# Patient Record
Sex: Female | Born: 1990 | Race: Black or African American | Hispanic: No | Marital: Single | Smoking: Never smoker
Health system: Southern US, Community
[De-identification: ages and names within clinical notes are randomized; demographics above are authoritative.]

## PROBLEM LIST (undated history)

## (undated) ENCOUNTER — Inpatient Hospital Stay (HOSPITAL_COMMUNITY): Payer: Self-pay

## (undated) ENCOUNTER — Emergency Department (HOSPITAL_COMMUNITY): Admission: EM | Discharge status: Against Medical Advice | Payer: Medicaid Other

## (undated) DIAGNOSIS — G43909 Migraine, unspecified, not intractable, without status migrainosus: Secondary | ICD-10-CM

## (undated) DIAGNOSIS — A599 Trichomoniasis, unspecified: Secondary | ICD-10-CM

## (undated) DIAGNOSIS — A749 Chlamydial infection, unspecified: Secondary | ICD-10-CM

## (undated) DIAGNOSIS — M549 Dorsalgia, unspecified: Secondary | ICD-10-CM

## (undated) HISTORY — PX: DILATION AND CURETTAGE OF UTERUS: SHX78

---
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
## 2012-11-19 ENCOUNTER — Encounter (HOSPITAL_COMMUNITY): Payer: Self-pay | Admitting: Emergency Medicine

## 2012-11-19 ENCOUNTER — Emergency Department (HOSPITAL_COMMUNITY)
Admission: EM | Admit: 2012-11-19 | Discharge: 2012-11-19 | Disposition: A | Discharge status: Home / Self Care | Payer: Medicaid Other | Attending: Emergency Medicine | Admitting: Emergency Medicine

## 2012-11-19 DIAGNOSIS — B9789 Other viral agents as the cause of diseases classified elsewhere: Secondary | ICD-10-CM | POA: Insufficient documentation

## 2012-11-19 DIAGNOSIS — H9202 Otalgia, left ear: Secondary | ICD-10-CM

## 2012-11-19 DIAGNOSIS — B349 Viral infection, unspecified: Secondary | ICD-10-CM

## 2012-11-19 DIAGNOSIS — J069 Acute upper respiratory infection, unspecified: Secondary | ICD-10-CM | POA: Insufficient documentation

## 2012-11-19 DIAGNOSIS — J3489 Other specified disorders of nose and nasal sinuses: Secondary | ICD-10-CM | POA: Insufficient documentation

## 2012-11-19 DIAGNOSIS — H9209 Otalgia, unspecified ear: Secondary | ICD-10-CM | POA: Insufficient documentation

## 2012-11-19 MED ORDER — ACETAMINOPHEN 325 MG PO TABS
650.0000 mg | ORAL_TABLET | Freq: Once | ORAL | Status: AC
  Administered 2012-11-19: 650 mg via ORAL
  Filled 2012-11-19 (×2): qty 2

## 2012-11-19 MED ORDER — PSEUDOEPHEDRINE HCL 60 MG PO TABS: 30.0000 mg | ORAL_TABLET | ORAL | Status: DC | PRN

## 2012-11-19 MED ORDER — IBUPROFEN 800 MG PO TABS
800.0000 mg | ORAL_TABLET | Freq: Once | ORAL | Status: AC
  Administered 2012-11-19: 800 mg via ORAL
  Filled 2012-11-19: qty 1

## 2012-11-19 NOTE — ED Provider Notes (Signed)
CSN: [id number]     Arrival date & time 11/19/12  0123 History     First MD Initiated Contact with Patient 11/19/12 0231     Chief Complaint  Patient presents with  . Fever   HPI.Catherine Weber is a 22 y.o. female complains of ear pain has been sharp, intermittent, severe, starting last night associated with fever, she's taking ibuprofen, 2 every 6 hours without relief. She's had some nasal congestion. Denies any headaches, stiff neck, chest pain, shortness of breath, productive cough, abdominal pain, nausea, vomiting, diarrhea.History reviewed. No pertinent past medical history. History reviewed. No pertinent past surgical history. No family history on file. History  Substance Use Topics  . Smoking status: Never Smoker   . Smokeless tobacco: Not on file  . Alcohol Use: No   OB History   Grav Para Term Preterm Abortions TAB SAB Ect Mult Living                 Review of Systems At least 10pt or greater review of systems completed and are negative except where specified in the HPI.  Allergies  Review of patient's allergies indicates no known allergies.  Home Medications   Current Outpatient Rx  Name  Route  Sig  Dispense  Refill  . ibuprofen (ADVIL,MOTRIN) 200 MG tablet   Oral   Take 400 mg by mouth every 6 (six) hours as needed for pain or headache.          BP 121/71  Pulse 103  Temp(Src) 100.1 F (37.8 C) (Oral)  Resp 18  SpO2 98%  LMP 10/31/2012 Physical Exam  Nursing notes reviewed.  Electronic medical record reviewed. VITAL SIGNS:   Filed Vitals:   11/19/12 0126 11/19/12 0231 11/19/12 0304  BP: 121/71  95/62  Pulse: 103  82  Temp: 102.2 F (39 C) 100.1 F (37.8 C) 100.1 F (37.8 C)  TempSrc: Oral Oral Oral  Resp: 18  16  SpO2: 98%  100%   CONSTITUTIONAL: Awake, oriented, appears non-toxic HENT: Atraumatic, normocephalic, oral mucosa pink and moist, airway patent. Nasal congestion with boggy nasal turbinates. External ears normal. Clear fluid behind  left TM, no erythema, no purulence EYES: Conjunctiva clear, EOMI, PERRLA NECK: Trachea midline, non-tender, supple CARDIOVASCULAR: Normal heart rate, Normal rhythm, No murmurs, rubs, gallops PULMONARY/CHEST: Clear to auscultation, no rhonchi, wheezes, or rales. Symmetrical breath sounds. Non-tender. ABDOMINAL: Non-distended, soft, non-tender - no rebound or guarding.  BS normal. NEUROLOGIC: Non-focal, moving all four extremities, no gross sensory or motor deficits. EXTREMITIES: No clubbing, cyanosis, or edema SKIN: Warm, Dry, No erythema, No rash  ED Course   Procedures (including critical care time)  Labs Reviewed - No data to display No results found. 1. Viral syndrome   2. URI (upper respiratory infection)   3. Otalgia, left     MDM  Catherine Weber is a 22 y.o. female patient with viral syndrome, upper respiratory infection causing congestion in the upper airways and likely occluding eustachian tube causing some pressure behind left TM. Does not appear infected, do not think she requires antibiotics. Patient has not been taking effective amounts of antipyretics, treat the patient's pain using ibuprofen, and she can start using Sudafed tomorrow morning to help her congestion.  .The patient appears reasonably screened and/or stabilized for discharge and I doubt any other medical condition or other [facility] requiring further screening, evaluation, or treatment in the ED exists or is present at this time prior to discharge.  Jones Skene, MD 11/20/12 [phone]

## 2012-11-19 NOTE — ED Notes (Addendum)
PT. REPORTS FEVER WITH BODY ACHES / BILATERAL EYE PHOTOPHOBIA ONSET LAST NIGHT UNRELIEVED BY OTC IBUPROFEN . DENIES COUGH OR CONGESTION . NO URINARY DISCOMFORT.

## 2012-11-19 NOTE — ED Notes (Signed)
Pt states that starting Monday morning she started feeling feverish and was sweating, she also says that her eyes were hurting and it was difficult to look up.

## 2012-11-22 ENCOUNTER — Emergency Department (HOSPITAL_COMMUNITY)
Admission: EM | Admit: 2012-11-22 | Discharge: 2012-11-22 | Disposition: A | Discharge status: Home / Self Care | Payer: Medicaid Other | Attending: Emergency Medicine | Admitting: Emergency Medicine

## 2012-11-22 ENCOUNTER — Encounter (HOSPITAL_COMMUNITY): Payer: Self-pay | Admitting: Emergency Medicine

## 2012-11-22 DIAGNOSIS — R197 Diarrhea, unspecified: Secondary | ICD-10-CM | POA: Insufficient documentation

## 2012-11-22 DIAGNOSIS — B349 Viral infection, unspecified: Secondary | ICD-10-CM

## 2012-11-22 DIAGNOSIS — B9789 Other viral agents as the cause of diseases classified elsewhere: Secondary | ICD-10-CM | POA: Insufficient documentation

## 2012-11-22 DIAGNOSIS — R51 Headache: Secondary | ICD-10-CM | POA: Insufficient documentation

## 2012-11-22 DIAGNOSIS — J069 Acute upper respiratory infection, unspecified: Secondary | ICD-10-CM | POA: Insufficient documentation

## 2012-11-22 DIAGNOSIS — R110 Nausea: Secondary | ICD-10-CM | POA: Insufficient documentation

## 2012-11-22 LAB — POCT PREGNANCY, URINE: Preg Test, Ur: NEGATIVE

## 2012-11-22 LAB — URINALYSIS, ROUTINE W REFLEX MICROSCOPIC
Hgb urine dipstick: NEGATIVE
Nitrite: NEGATIVE
Specific Gravity, Urine: 1.022 (ref 1.005–1.030)
pH: 6.5 (ref 5.0–8.0)

## 2012-11-22 LAB — URINE MICROSCOPIC-ADD ON

## 2012-11-22 MED ORDER — DIPHENHYDRAMINE HCL 50 MG/ML IJ SOLN
25.0000 mg | Freq: Once | INTRAMUSCULAR | Status: AC
  Administered 2012-11-22: 25 mg via INTRAVENOUS
  Filled 2012-11-22: qty 1

## 2012-11-22 MED ORDER — METOCLOPRAMIDE HCL 5 MG/ML IJ SOLN
5.0000 mg | Freq: Once | INTRAMUSCULAR | Status: AC
  Administered 2012-11-22: 5 mg via INTRAVENOUS
  Filled 2012-11-22: qty 2

## 2012-11-22 MED ORDER — SODIUM CHLORIDE 0.9 % IV BOLUS (SEPSIS)
1000.0000 mL | Freq: Once | INTRAVENOUS | Status: AC
  Administered 2012-11-22: 1000 mL via INTRAVENOUS

## 2012-11-22 NOTE — ED Notes (Signed)
PT. REPORTS DIARRHEA , HEADACHE AND NAUSEA FOR SEVERAL DAYS , DENIES EMESIS , SEEN HERE 10/19/2012 DIAGNOSED WITH VIRAL ILLNESS / URI PRESCRIBED WITH SUDAFED WITH NO IMPROVEMENT.

## 2012-11-23 NOTE — ED Provider Notes (Signed)
CSN: [id number]     Arrival date & time 11/22/12  1747 History     First MD Initiated Contact with Patient 11/22/12 2035     Chief Complaint  Patient presents with  . Diarrhea   (Consider location/radiation/quality/duration/timing/severity/associated sxs/prior Treatment) Patient is a 22 y.o. female presenting with diarrhea. The history is provided by the patient.  Diarrhea Associated symptoms: headaches   Associated symptoms: no abdominal pain, no fever and no vomiting   This is a 22 year old female who presents with diarrhea, headache and nausea. Patient was diagnosed 3 days ago with a viral illness and URI. The patient states that since that time she has developed diarrhea without vomiting. She does endorse nausea. She states she has had a persistent headache without vision changes or neurologic deficit. Ibuprofen has not helped. She denies any fevers but does endorse chills.  History reviewed. No pertinent past medical history. History reviewed. No pertinent past surgical history. No family history on file. History  Substance Use Topics  . Smoking status: Never Smoker   . Smokeless tobacco: Not on file  . Alcohol Use: No   OB History   Grav Para Term Preterm Abortions TAB SAB Ect Mult Living                 Review of Systems  Constitutional: Negative.  Negative for fever.  HENT: Negative for ear pain.   Cardiovascular: Negative for chest pain.  Gastrointestinal: Positive for nausea and diarrhea. Negative for vomiting and abdominal pain.  Genitourinary: Negative for dysuria.  Skin: Negative for rash.  Neurological: Positive for headaches. Negative for dizziness and syncope.  Psychiatric/Behavioral: Negative for confusion.  All other systems reviewed and are negative.    Allergies  Review of patient's allergies indicates no known allergies.  Home Medications   Current Outpatient Rx  Name  Route  Sig  Dispense  Refill  . ibuprofen (ADVIL,MOTRIN) 200 MG tablet  Oral   Take 400 mg by mouth every 6 (six) hours as needed for pain or headache.         . Multiple Vitamins-Minerals (MULTIVITAMIN PO)   Oral   Take 1 tablet by mouth daily.         . pseudoephedrine (SUDAFED) 60 MG tablet   Oral   Take 0.5 tablets (30 mg total) by mouth every 4 (four) hours as needed for congestion.   30 tablet   0    BP 108/72  Pulse 80  Temp(Src) 99.9 F (37.7 C) (Oral)  Resp 16  SpO2 100%  LMP 10/31/2012 Physical Exam  Nursing note and vitals reviewed. Constitutional: She is oriented to person, place, and time. She appears well-developed and well-nourished.  HENT:  Head: Normocephalic and atraumatic.  Eyes: Pupils are equal, round, and reactive to light.  Neck: Normal range of motion. Neck supple.  Cardiovascular: Normal rate, regular rhythm and normal heart sounds.   Pulmonary/Chest: Effort normal and breath sounds normal.  Abdominal: Soft. Bowel sounds are normal.  Neurological: She is alert and oriented to person, place, and time.  Skin: Skin is warm and dry.  Psychiatric: She has a normal mood and affect.    ED Course   Procedures (including critical care time)  Labs Reviewed  URINALYSIS, ROUTINE W REFLEX MICROSCOPIC - Abnormal; Notable for the following:    APPearance CLOUDY (*)    Ketones, ur 40 (*)    Leukocytes, UA TRACE (*)    All other components within normal limits  URINE MICROSCOPIC-ADD ON -  Abnormal; Notable for the following:    Squamous Epithelial / LPF FEW (*)    Bacteria, UA FEW (*)    All other components within normal limits  POCT PREGNANCY, URINE   No results found. 1. Viral syndrome   2. Diarrhea     MDM  Is a 22 year old female who presents with diarrhea, headache and nausea. She is nontoxic-appearing on exam and her vital signs are within normal limits. Patient was given Reglan, Benadryl, and a normal saline bolus for her headache. Urinalysis is negative for acute changes suggesting that she is hydrated.   Patient had improvement of her symptoms while in the ER.  Given her recent prodrome of URI, nausea, and diarrhea I feel her symptoms are still most consistent with a viral syndrome. Her belly is benign. She has no meningismus suggestive of meningitis.  Patient was discharged home and curettage to use ibuprofen and Tylenol as needed for her headache.  Shon Baton, MD 11/23/12 [phone]

## 2015-01-27 ENCOUNTER — Encounter (HOSPITAL_COMMUNITY): Payer: Self-pay | Admitting: *Deleted

## 2015-01-27 ENCOUNTER — Emergency Department (HOSPITAL_COMMUNITY)
Admission: EM | Admit: 2015-01-27 | Discharge: 2015-01-27 | Disposition: A | Discharge status: Home / Self Care | Payer: BLUE CROSS/BLUE SHIELD | Attending: Emergency Medicine | Admitting: Emergency Medicine

## 2015-01-27 DIAGNOSIS — R51 Headache: Secondary | ICD-10-CM

## 2015-01-27 DIAGNOSIS — Z79899 Other long term (current) drug therapy: Secondary | ICD-10-CM | POA: Diagnosis not present

## 2015-01-27 DIAGNOSIS — M25512 Pain in left shoulder: Secondary | ICD-10-CM

## 2015-01-27 DIAGNOSIS — Y9389 Activity, other specified: Secondary | ICD-10-CM | POA: Insufficient documentation

## 2015-01-27 DIAGNOSIS — Y92410 Unspecified street and highway as the place of occurrence of the external cause: Secondary | ICD-10-CM | POA: Insufficient documentation

## 2015-01-27 DIAGNOSIS — Y998 Other external cause status: Secondary | ICD-10-CM | POA: Diagnosis not present

## 2015-01-27 DIAGNOSIS — S0990XA Unspecified injury of head, initial encounter: Secondary | ICD-10-CM | POA: Diagnosis present

## 2015-01-27 DIAGNOSIS — S4992XA Unspecified injury of left shoulder and upper arm, initial encounter: Secondary | ICD-10-CM | POA: Diagnosis not present

## 2015-01-27 DIAGNOSIS — R519 Headache, unspecified: Secondary | ICD-10-CM

## 2015-01-27 MED ORDER — IBUPROFEN 400 MG PO TABS
800.0000 mg | ORAL_TABLET | Freq: Once | ORAL | Status: AC
  Administered 2015-01-27: 800 mg via ORAL
  Filled 2015-01-27: qty 2

## 2015-01-27 MED ORDER — METHOCARBAMOL 500 MG PO TABS: 500.0000 mg | ORAL_TABLET | Freq: Two times a day (BID) | ORAL | Status: DC

## 2015-01-27 MED ORDER — IBUPROFEN 800 MG PO TABS: 800.0000 mg | ORAL_TABLET | Freq: Three times a day (TID) | ORAL | Status: DC
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
## 2015-02-14 ENCOUNTER — Other Ambulatory Visit (HOSPITAL_COMMUNITY)
Admission: RE | Admit: 2015-02-14 | Discharge: 2015-02-14 | Disposition: A | Discharge status: Home / Self Care | Payer: BLUE CROSS/BLUE SHIELD | Source: Ambulatory Visit | Attending: Family Medicine | Admitting: Family Medicine

## 2015-02-14 ENCOUNTER — Emergency Department (HOSPITAL_COMMUNITY)
Admission: EM | Admit: 2015-02-14 | Discharge: 2015-02-14 | Disposition: A | Discharge status: Home / Self Care | Payer: BLUE CROSS/BLUE SHIELD | Source: Home / Self Care

## 2015-02-14 ENCOUNTER — Encounter (HOSPITAL_COMMUNITY): Payer: Self-pay | Admitting: Emergency Medicine

## 2015-02-14 DIAGNOSIS — N760 Acute vaginitis: Secondary | ICD-10-CM

## 2015-02-14 DIAGNOSIS — N390 Urinary tract infection, site not specified: Secondary | ICD-10-CM

## 2015-02-14 DIAGNOSIS — Z113 Encounter for screening for infections with a predominantly sexual mode of transmission: Secondary | ICD-10-CM | POA: Diagnosis present

## 2015-02-14 LAB — POCT PREGNANCY, URINE: PREG TEST UR: NEGATIVE

## 2015-02-14 MED ORDER — CEFTRIAXONE SODIUM 250 MG IJ SOLR
250.0000 mg | Freq: Once | INTRAMUSCULAR | Status: AC
  Administered 2015-02-14: 250 mg via INTRAMUSCULAR

## 2015-02-14 MED ORDER — LIDOCAINE HCL (PF) 1 % IJ SOLN
INTRAMUSCULAR | Status: AC
  Filled 2015-02-14: qty 5

## 2015-02-14 MED ORDER — AZITHROMYCIN 250 MG PO TABS
ORAL_TABLET | ORAL | Status: AC
  Filled 2015-02-14: qty 4

## 2015-02-14 MED ORDER — METRONIDAZOLE 500 MG PO TABS: ORAL_TABLET | ORAL | Status: DC

## 2015-02-14 MED ORDER — AZITHROMYCIN 250 MG PO TABS
1000.0000 mg | ORAL_TABLET | Freq: Once | ORAL | Status: AC
  Administered 2015-02-14: 1000 mg via ORAL

## 2015-02-14 MED ORDER — CEFTRIAXONE SODIUM 250 MG IJ SOLR
INTRAMUSCULAR | Status: AC
  Filled 2015-02-14: qty 250

## 2015-02-14 MED ORDER — SULFAMETHOXAZOLE-TRIMETHOPRIM 800-160 MG PO TABS: 1.0000 | ORAL_TABLET | Freq: Two times a day (BID) | ORAL | Status: AC

## 2015-02-14 NOTE — ED Notes (Signed)
The patient [facility] with a complaint of vaginal swelling and discharge that started 4 days ago.

## 2015-02-14 NOTE — ED Provider Notes (Signed)
CSN: [id number]     Arrival date & time 02/14/15  1307 History   None    Chief Complaint  Patient presents with  . Groin Swelling  . Vaginal Discharge   (Consider location/radiation/quality/duration/timing/severity/associated sxs/prior Treatment) HPI History obtained from patient:   LOCATION:vagina SEVERITY: no pain DURATION: over 1 week CONTEXT: onset of pain, swelling, discharge (yellow) after new sexual partner QUALITY:sore  MODIFYING FACTORS:none ASSOCIATED SYMPTOMS: smelly discharge TIMING:constant  History reviewed. No pertinent past medical history. History reviewed. No pertinent past surgical history. History reviewed. No pertinent family history. Social History  Substance Use Topics  . Smoking status: Never Smoker   . Smokeless tobacco: None  . Alcohol Use: No   OB History    No data available     Review of Systems ROS +'ve vaginal discharge, pain  Denies: HEADACHE, NAUSEA, ABDOMINAL PAIN, CHEST PAIN, CONGESTION, DYSURIA, SHORTNESS OF BREATH  Allergies  Review of patient's allergies indicates no known allergies.  Home Medications   Prior to Admission medications   Medication Sig Start Date End Date Taking? Authorizing Provider  ibuprofen (ADVIL,MOTRIN) 200 MG tablet Take 400 mg by mouth every 6 (six) hours as needed for pain or headache.    Historical Provider, MD  ibuprofen (ADVIL,MOTRIN) 800 MG tablet Take 1 tablet (800 mg total) by mouth 3 (three) times daily. 01/27/15   Shawn C Joy, PA-C  methocarbamol (ROBAXIN) 500 MG tablet Take 1 tablet (500 mg total) by mouth 2 (two) times daily. 01/27/15   Shawn C Joy, PA-C  metroNIDAZOLE (FLAGYL) 500 MG tablet Take all 4 tablets at one time. 02/14/15   Konrad Felix, PA  Multiple Vitamins-Minerals (MULTIVITAMIN PO) Take 1 tablet by mouth daily.    Historical Provider, MD  pseudoephedrine (SUDAFED) 60 MG tablet Take 0.5 tablets (30 mg total) by mouth every 4 (four) hours as needed for congestion. 11/19/12    [name], MD  sulfamethoxazole-trimethoprim (BACTRIM DS,SEPTRA DS) 800-160 MG tablet Take 1 tablet by mouth 2 (two) times daily. 02/14/15 02/21/15  Konrad Felix, PA   Meds Ordered and Administered this Visit   Medications  cefTRIAXone (ROCEPHIN) injection 250 mg (not administered)  azithromycin (ZITHROMAX) tablet 1,000 mg (not administered)    BP 116/73 mmHg  Pulse 83  Temp(Src) 98.7 F (37.1 C) (Oral)  Resp 17  SpO2 100%  LMP 02/01/2015 (Exact Date) No data found.   Physical Exam  Constitutional: She appears well-developed and well-nourished. No distress.  HENT:  Head: Normocephalic and atraumatic.  Cardiovascular: Normal rate.   Pulmonary/Chest: Effort normal and breath sounds normal.  Abdominal: Soft. Bowel sounds are normal. There is no tenderness. There is no rebound.  Genitourinary:    Cervix exhibits no motion tenderness and no discharge. There are signs of injury in the vagina. Vaginal discharge found.    Nursing note and vitals reviewed.   ED Course  Procedures (including critical care time)  Labs Review Labs Reviewed  POCT PREGNANCY, URINE  CERVICOVAGINAL ANCILLARY ONLY    Imaging Review No results found.   Visual Acuity Review  Right Eye Distance:   Left Eye Distance:   Bilateral Distance:    Right Eye Near:   Left Eye Near:    Bilateral Near:         MDM   1. Vaginitis   2. UTI (lower urinary tract infection)    Exam does not reveal CMT, no pus from cervix. Discharge is c/w trich  UA is positive  Rx metronidazole and bactrim  Advise to call for culture results in 3 days Return if there are new or worsening of symptoms Use condoms with new sexual partners    Konrad Felix, [place] 02/14/15 [phone]

## 2015-02-14 NOTE — Discharge Instructions (Signed)

## 2015-02-15 LAB — POCT URINALYSIS DIP (DEVICE)
Bilirubin Urine: NEGATIVE
GLUCOSE, UA: NEGATIVE mg/dL
HGB URINE DIPSTICK: NEGATIVE
KETONES UR: NEGATIVE mg/dL
Nitrite: POSITIVE — AB
Protein, ur: NEGATIVE mg/dL
SPECIFIC GRAVITY, URINE: 1.015 (ref 1.005–1.030)
UROBILINOGEN UA: 0.2 mg/dL (ref 0.0–1.0)
pH: 8.5 — ABNORMAL HIGH (ref 5.0–8.0)

## 2015-02-15 LAB — CERVICOVAGINAL ANCILLARY ONLY
CHLAMYDIA, DNA PROBE: NEGATIVE
Neisseria Gonorrhea: NEGATIVE
Trichomonas: NEGATIVE

## 2015-02-15 NOTE — ED Notes (Signed)
GC chlamydia, trich reports negative. Still waiting on wet prep report

## 2015-02-16 LAB — CERVICOVAGINAL ANCILLARY ONLY: Wet Prep (BD Affirm): POSITIVE — AB

## 2015-02-16 NOTE — ED Notes (Signed)
Patient called to discuss lab reports. After verifying ID, discussed positive BV finding, treated adequately w Flagyl Rx day of [facility]CC visit

## 2015-02-26 ENCOUNTER — Emergency Department (HOSPITAL_COMMUNITY)
Admission: EM | Admit: 2015-02-26 | Discharge: 2015-02-26 | Disposition: A | Discharge status: Home / Self Care | Payer: BLUE CROSS/BLUE SHIELD | Attending: Emergency Medicine | Admitting: Emergency Medicine

## 2015-02-26 ENCOUNTER — Encounter (HOSPITAL_COMMUNITY): Payer: Self-pay | Admitting: Emergency Medicine

## 2015-02-26 DIAGNOSIS — R51 Headache: Secondary | ICD-10-CM | POA: Insufficient documentation

## 2015-02-26 DIAGNOSIS — R112 Nausea with vomiting, unspecified: Secondary | ICD-10-CM | POA: Diagnosis not present

## 2015-02-26 DIAGNOSIS — R519 Headache, unspecified: Secondary | ICD-10-CM

## 2015-02-26 DIAGNOSIS — Z79899 Other long term (current) drug therapy: Secondary | ICD-10-CM | POA: Diagnosis not present

## 2015-02-26 DIAGNOSIS — Z3202 Encounter for pregnancy test, result negative: Secondary | ICD-10-CM | POA: Insufficient documentation

## 2015-02-26 LAB — COMPREHENSIVE METABOLIC PANEL
ALT: 16 U/L (ref 14–54)
AST: 27 U/L (ref 15–41)
Albumin: 4 g/dL (ref 3.5–5.0)
Alkaline Phosphatase: 53 U/L (ref 38–126)
Anion gap: 7 (ref 5–15)
BUN: 6 mg/dL (ref 6–20)
CHLORIDE: 105 mmol/L (ref 101–111)
CO2: 27 mmol/L (ref 22–32)
CREATININE: 0.83 mg/dL (ref 0.44–1.00)
Calcium: 9.5 mg/dL (ref 8.9–10.3)
GFR calc Af Amer: >60 mL/min (ref >60)
Glucose, Bld: 90 mg/dL (ref 65–99)
Potassium: 3.4 mmol/L — ABNORMAL LOW (ref 3.5–5.1)
Sodium: 139 mmol/L (ref 135–145)
Total Bilirubin: 0.4 mg/dL (ref 0.3–1.2)
Total Protein: 7.6 g/dL (ref 6.5–8.1)

## 2015-02-26 LAB — URINALYSIS, ROUTINE W REFLEX MICROSCOPIC
BILIRUBIN URINE: NEGATIVE
GLUCOSE, UA: NEGATIVE mg/dL
KETONES UR: 15 mg/dL — AB
Leukocytes, UA: NEGATIVE
Nitrite: NEGATIVE
PROTEIN: NEGATIVE mg/dL
Specific Gravity, Urine: 1.02 (ref 1.005–1.030)
pH: 5.5 (ref 5.0–8.0)

## 2015-02-26 LAB — URINE MICROSCOPIC-ADD ON

## 2015-02-26 LAB — CBC
HCT: 36.9 % (ref 36.0–46.0)
Hemoglobin: 12.1 g/dL (ref 12.0–15.0)
MCH: 27.6 pg (ref 26.0–34.0)
MCHC: 32.8 g/dL (ref 30.0–36.0)
MCV: 84.2 fL (ref 78.0–100.0)
PLATELETS: 174 10*3/uL (ref 150–400)
RBC: 4.38 MIL/uL (ref 3.87–5.11)
RDW: 13.2 % (ref 11.5–15.5)
WBC: 5.3 10*3/uL (ref 4.0–10.5)

## 2015-02-26 LAB — POC URINE PREG, ED: Preg Test, Ur: NEGATIVE

## 2015-02-26 MED ORDER — DIPHENHYDRAMINE HCL 50 MG/ML IJ SOLN
25.0000 mg | Freq: Once | INTRAMUSCULAR | Status: AC
  Administered 2015-02-26: 25 mg via INTRAMUSCULAR
  Filled 2015-02-26: qty 1

## 2015-02-26 MED ORDER — KETOROLAC TROMETHAMINE 30 MG/ML IJ SOLN
30.0000 mg | Freq: Once | INTRAMUSCULAR | Status: AC
  Administered 2015-02-26: 30 mg via INTRAMUSCULAR
  Filled 2015-02-26: qty 1

## 2015-02-26 MED ORDER — METOCLOPRAMIDE HCL 5 MG/ML IJ SOLN
10.0000 mg | Freq: Once | INTRAMUSCULAR | Status: AC
  Administered 2015-02-26: 10 mg via INTRAMUSCULAR
  Filled 2015-02-26: qty 2

## 2015-02-26 NOTE — ED Provider Notes (Signed)
CSN: [id number]     Arrival date & time 02/26/15  1901 History  By signing my name below, I, Randa Evens, attest that this documentation has been prepared under the direction and in the presence of Glendell Docker, NP. Electronically Signed: Randa Evens, ED Scribe. 02/26/2015. 9:11 PM.    Chief Complaint  Patient presents with  . Headache  . Emesis     The history is provided by the patient. No language interpreter was used.    HPI Comments: Catherine Weber is a 24 y.o. female who presents to the Emergency Department complaining of intermittent worsening migraine HA that began 5 days prior. Pt states that she has intermittent nausea and vomiting x2. Pt states that she has tried ibuprofen with no relief. Denies neck stiffness, fever, rhinorrhea syncope or other related symptoms.LMP 02/25/2015   History reviewed. No pertinent past medical history. History reviewed. No pertinent past surgical history. No family history on file. Social History  Substance Use Topics  . Smoking status: Never Smoker   . Smokeless tobacco: None  . Alcohol Use: No   OB History    No data available     Review of Systems  Constitutional: Negative for fever.  HENT: Negative for rhinorrhea.   Gastrointestinal: Positive for nausea and vomiting.  Neurological: Positive for headaches.  All other systems reviewed and are negative.     Allergies  Review of patient's allergies indicates no known allergies.  Home Medications   Prior to Admission medications   Medication Sig Start Date End Date Taking? Authorizing Provider  ibuprofen (ADVIL,MOTRIN) 200 MG tablet Take 400 mg by mouth every 6 (six) hours as needed for pain or headache.    Historical Provider, MD  ibuprofen (ADVIL,MOTRIN) 800 MG tablet Take 1 tablet (800 mg total) by mouth 3 (three) times daily. 01/27/15   Shawn C Joy, PA-C  methocarbamol (ROBAXIN) 500 MG tablet Take 1 tablet (500 mg total) by mouth 2 (two) times daily. 01/27/15    Shawn C Joy, PA-C  metroNIDAZOLE (FLAGYL) 500 MG tablet Take all 4 tablets at one time. 02/14/15   Konrad Felix, PA  Multiple Vitamins-Minerals (MULTIVITAMIN PO) Take 1 tablet by mouth daily.    Historical Provider, MD  pseudoephedrine (SUDAFED) 60 MG tablet Take 0.5 tablets (30 mg total) by mouth every 4 (four) hours as needed for congestion. 11/19/12   [name], MD   BP 110/69 mmHg  Pulse 84  Temp(Src) 98.1 F (36.7 C) (Oral)  Resp 16  Ht 4\' 11"  (1.499 m)  Wt 145 lb (65.772 kg)  BMI 29.27 kg/m2  LMP 02/25/2015   Physical Exam  Constitutional: She is oriented to person, place, and time. She appears well-developed and well-nourished. No distress.  HENT:  Head: Normocephalic and atraumatic.  Eyes: Conjunctivae and EOM are normal. Pupils are equal, round, and reactive to light.  Neck: Neck supple. No tracheal deviation present.  Cardiovascular: Normal rate.   Pulmonary/Chest: Effort normal and breath sounds normal. No respiratory distress.  Abdominal: Soft. Bowel sounds are normal. There is no tenderness.  Musculoskeletal: Normal range of motion.  Neurological: She is alert and oriented to person, place, and time. She exhibits normal muscle tone. Coordination normal.  Skin: Skin is warm and dry.  Psychiatric: She has a normal mood and affect. Her behavior is normal.  Nursing note and vitals reviewed.   ED Course  Procedures (including critical care time) DIAGNOSTIC STUDIES: Oxygen Saturation is 100% on RA, normal by my interpretation.  COORDINATION OF CARE: 9:10 PM-Discussed treatment plan with pt at bedside and pt agreed to plan.     Labs Review Labs Reviewed  COMPREHENSIVE METABOLIC PANEL - Abnormal; Notable for the following:    Potassium 3.4 (*)    All other components within normal limits  URINALYSIS, ROUTINE W REFLEX MICROSCOPIC (NOT AT [facility]) - Abnormal; Notable for the following:    APPearance CLOUDY (*)    Hgb urine dipstick LARGE (*)    Ketones, ur 15  (*)    All other components within normal limits  URINE MICROSCOPIC-ADD ON - Abnormal; Notable for the following:    Squamous Epithelial / LPF 0-5 (*)    Bacteria, UA FEW (*)    All other components within normal limits  URINE CULTURE  CBC  POC URINE PREG, ED    Imaging Review No results found.    EKG Interpretation None      MDM   Final diagnoses:  Headache, unspecified headache type   No red flag symptoms. Pt if feeling better after migraine cocktail. Will send home   I personally performed the services described in this documentation, which was scribed in my presence. The recorded information has been reviewed and is accurate.     Glendell Docker, NP 02/26/15 [place], MD 02/27/15 [phone]

## 2015-02-26 NOTE — Discharge Instructions (Signed)

## 2015-02-26 NOTE — ED Notes (Signed)
Pt. reports intermittent headache onset last Sunday and emesis yesterday and today , denies fever / no diarrhea.

## 2015-02-28 LAB — URINE CULTURE

## 2015-10-29 ENCOUNTER — Encounter (HOSPITAL_COMMUNITY): Payer: Self-pay | Admitting: Emergency Medicine

## 2015-10-29 ENCOUNTER — Emergency Department (HOSPITAL_COMMUNITY)
Admission: EM | Admit: 2015-10-29 | Discharge: 2015-10-29 | Disposition: A | Discharge status: Home / Self Care | Payer: BLUE CROSS/BLUE SHIELD | Attending: Emergency Medicine | Admitting: Emergency Medicine

## 2015-10-29 DIAGNOSIS — F17200 Nicotine dependence, unspecified, uncomplicated: Secondary | ICD-10-CM | POA: Diagnosis not present

## 2015-10-29 DIAGNOSIS — B9689 Other specified bacterial agents as the cause of diseases classified elsewhere: Secondary | ICD-10-CM | POA: Diagnosis not present

## 2015-10-29 DIAGNOSIS — N760 Acute vaginitis: Secondary | ICD-10-CM | POA: Diagnosis not present

## 2015-10-29 DIAGNOSIS — R102 Pelvic and perineal pain: Secondary | ICD-10-CM | POA: Diagnosis present

## 2015-10-29 LAB — GC/CHLAMYDIA PROBE AMP (~~LOC~~) NOT AT ARMC
Chlamydia: POSITIVE — AB
NEISSERIA GONORRHEA: NEGATIVE

## 2015-10-29 LAB — COMPREHENSIVE METABOLIC PANEL
ALBUMIN: 4.2 g/dL (ref 3.5–5.0)
ALT: 8 U/L — ABNORMAL LOW (ref 14–54)
ANION GAP: 6 (ref 5–15)
AST: 15 U/L (ref 15–41)
Alkaline Phosphatase: 42 U/L (ref 38–126)
BILIRUBIN TOTAL: 0.5 mg/dL (ref 0.3–1.2)
BUN: 8 mg/dL (ref 6–20)
CHLORIDE: 106 mmol/L (ref 101–111)
CO2: 26 mmol/L (ref 22–32)
Calcium: 9.5 mg/dL (ref 8.9–10.3)
Creatinine, Ser: 0.9 mg/dL (ref 0.44–1.00)
GFR calc Af Amer: >60 mL/min (ref >60)
GFR calc non Af Amer: >60 mL/min (ref >60)
GLUCOSE: 64 mg/dL — AB (ref 65–99)
POTASSIUM: 3.3 mmol/L — AB (ref 3.5–5.1)
SODIUM: 138 mmol/L (ref 135–145)
TOTAL PROTEIN: 6.9 g/dL (ref 6.5–8.1)

## 2015-10-29 LAB — URINE MICROSCOPIC-ADD ON

## 2015-10-29 LAB — URINALYSIS, ROUTINE W REFLEX MICROSCOPIC
BILIRUBIN URINE: NEGATIVE
Glucose, UA: NEGATIVE mg/dL
Hgb urine dipstick: NEGATIVE
Ketones, ur: 15 mg/dL — AB
NITRITE: NEGATIVE
PH: 6 (ref 5.0–8.0)
Protein, ur: NEGATIVE mg/dL
SPECIFIC GRAVITY, URINE: 1.028 (ref 1.005–1.030)

## 2015-10-29 LAB — CBC
HEMATOCRIT: 35.5 % — AB (ref 36.0–46.0)
HEMOGLOBIN: 11.4 g/dL — AB (ref 12.0–15.0)
MCH: 27.7 pg (ref 26.0–34.0)
MCHC: 32.1 g/dL (ref 30.0–36.0)
MCV: 86.4 fL (ref 78.0–100.0)
Platelets: 214 10*3/uL (ref 150–400)
RBC: 4.11 MIL/uL (ref 3.87–5.11)
RDW: 12.9 % (ref 11.5–15.5)
WBC: 7.6 10*3/uL (ref 4.0–10.5)

## 2015-10-29 LAB — LIPASE, BLOOD: LIPASE: 33 U/L (ref 11–51)

## 2015-10-29 LAB — WET PREP, GENITAL
Sperm: NONE SEEN
Trich, Wet Prep: NONE SEEN
Yeast Wet Prep HPF POC: NONE SEEN

## 2015-10-29 LAB — I-STAT BETA HCG BLOOD, ED (MC, WL, AP ONLY): I-stat hCG, quantitative: <5 m[IU]/mL (ref <5)

## 2015-10-29 MED ORDER — CEFTRIAXONE SODIUM 250 MG IJ SOLR
250.0000 mg | Freq: Once | INTRAMUSCULAR | Status: AC
  Administered 2015-10-29: 250 mg via INTRAMUSCULAR
  Filled 2015-10-29: qty 250

## 2015-10-29 MED ORDER — STERILE WATER FOR INJECTION IJ SOLN
INTRAMUSCULAR | Status: AC
  Administered 2015-10-29: 0.9 mL
  Filled 2015-10-29: qty 10

## 2015-10-29 MED ORDER — POTASSIUM CHLORIDE CRYS ER 20 MEQ PO TBCR
40.0000 meq | EXTENDED_RELEASE_TABLET | Freq: Once | ORAL | Status: AC
  Administered 2015-10-29: 40 meq via ORAL
  Filled 2015-10-29: qty 2

## 2015-10-29 MED ORDER — METRONIDAZOLE 500 MG PO TABS: 500.0000 mg | ORAL_TABLET | Freq: Two times a day (BID) | ORAL | Status: DC

## 2015-10-29 MED ORDER — AZITHROMYCIN 250 MG PO TABS
1000.0000 mg | ORAL_TABLET | Freq: Once | ORAL | Status: AC
  Administered 2015-10-29: 1000 mg via ORAL
  Filled 2015-10-29: qty 4

## 2015-10-29 NOTE — Discharge Instructions (Signed)
Bacterial Vaginosis Bacterial vaginosis is a vaginal infection that occurs when the normal balance of bacteria in the vagina is disrupted. It results from an overgrowth of certain bacteria. This is the most common vaginal infection in women of childbearing age. Treatment is important to prevent complications, especially in pregnant women, as it can cause a premature delivery. CAUSES  Bacterial vaginosis is caused by an increase in harmful bacteria that are normally present in smaller amounts in the vagina. Several different kinds of bacteria can cause bacterial vaginosis. However, the reason that the condition develops is not fully understood. RISK FACTORS Certain activities or behaviors can put you at an increased risk of developing bacterial vaginosis, including:  Having a new sex partner or multiple sex partners.  Douching.  Using an intrauterine device (IUD) for contraception. Women do not get bacterial vaginosis from toilet seats, bedding, swimming pools, or contact with objects around them. SIGNS AND SYMPTOMS  Some women with bacterial vaginosis have no signs or symptoms. Common symptoms include:  Grey vaginal discharge.  A fishlike odor with discharge, especially after sexual intercourse.  Itching or burning of the vagina and vulva.  Burning or pain with urination. DIAGNOSIS  Your health care provider will take a medical history and examine the vagina for signs of bacterial vaginosis. A sample of vaginal fluid may be taken. Your health care provider will look at this sample under a microscope to check for bacteria and abnormal cells. A vaginal pH test may also be done.  TREATMENT  Bacterial vaginosis may be treated with antibiotic medicines. These may be given in the form of a pill or a vaginal cream. A second round of antibiotics may be prescribed if the condition comes back after treatment. Because bacterial vaginosis increases your risk for sexually transmitted diseases, getting  treated can help reduce your risk for chlamydia, gonorrhea, HIV, and herpes. HOME CARE INSTRUCTIONS   Only take over-the-counter or prescription medicines as directed by your health care provider.  If antibiotic medicine was prescribed, take it as directed. Make sure you finish it even if you start to feel better.  Tell all sexual partners that you have a vaginal infection. They should see their health care provider and be treated if they have problems, such as a mild rash or itching.  During treatment, it is important that you follow these instructions:  Avoid sexual activity or use condoms correctly.  Do not douche.  Avoid alcohol as directed by your health care provider.  Avoid breastfeeding as directed by your health care provider. SEEK MEDICAL CARE IF:   Your symptoms are not improving after 3 days of treatment.  You have increased discharge or pain.  You have a fever. MAKE SURE YOU:   Understand these instructions.  Will watch your condition.  Will get help right away if you are not doing well or get worse. FOR MORE INFORMATION  Centers for Disease Control and Prevention, Division of STD Prevention: [employer] American Sexual Health Association (ASHA): www.ashastd.org    This information is not intended to replace advice given to you by your health care provider. Make sure you discuss any questions you have with your health care provider.   Document Released: 03/27/2005 Document Revised: 04/17/2014 Document Reviewed: 11/06/2012 Elsevier Interactive Patient Education 2016 [place] Sex Safe sex is about reducing the risk of giving or getting a sexually transmitted disease (STD). STDs are spread through sexual contact involving the genitals, mouth, or rectum. Some STDs can be cured and others  cannot. Safe sex can also prevent unintended pregnancies.  WHAT ARE SOME SAFE SEX PRACTICES?  Limit your sexual activity to only one partner who is having sex with  only you.  Talk to your partner about his or her past partners, past STDs, and drug use.  Use a condom every time you have sexual intercourse. This includes vaginal, oral, and anal sexual activity. Both females and males should wear condoms during oral sex. Only use latex or polyurethane condoms and water-based lubricants. Using petroleum-based lubricants or oils to lubricate a condom will weaken the condom and increase the chance that it will break. The condom should be in place from the beginning to the end of sexual activity. Wearing a condom reduces, but does not completely eliminate, your risk of getting or giving an STD. STDs can be spread by contact with infected body fluids and skin.  Get vaccinated for hepatitis B and HPV.  Avoid alcohol and recreational drugs, which can affect your judgment. You may forget to use a condom or participate in high-risk sex.  For females, avoid douching after sexual intercourse. Douching can spread an infection farther into the reproductive tract.  Check your body for signs of sores, blisters, rashes, or unusual discharge. See your health care provider if you notice any of these signs.  Avoid sexual contact if you have symptoms of an infection or are being treated for an STD. If you or your partner has herpes, avoid sexual contact when blisters are present. Use condoms at all other times.  If you are at risk of being infected with HIV, it is recommended that you take a prescription medicine daily to prevent HIV infection. This is called pre-exposure prophylaxis (PrEP). You are considered at risk if:  You are a man who has sex with other men (MSM).  You are a heterosexual man or woman who is sexually active with more than one partner.  You take drugs by injection.  You are sexually active with a partner who has HIV.  Talk with your health care provider about whether you are at high risk of being infected with HIV. If you choose to begin PrEP, you  should first be tested for HIV. You should then be tested every 3 months for as long as you are taking PrEP.  See your health care provider for regular screenings, exams, and tests for other STDs. Before having sex with a new partner, each of you should be screened for STDs and should talk about the results with each other. WHAT ARE THE BENEFITS OF SAFE SEX?   There is less chance of getting or giving an STD.  You can prevent unwanted or unintended pregnancies.  By discussing safe sex concerns with your partner, you may increase feelings of intimacy, comfort, trust, and honesty between the two of you.   This information is not intended to replace advice given to you by your health care provider. Make sure you discuss any questions you have with your health care provider.   Document Released: 05/04/2004 Document Revised: 04/17/2014 Document Reviewed: 09/18/2011 Elsevier Interactive Patient Education [employer].

## 2015-10-29 NOTE — ED Provider Notes (Signed)
CSN: [id number]     Arrival date & time 10/29/15  0113 History   First MD Initiated Contact with Patient 10/29/15 [phone]     Chief Complaint  Patient presents with  . Pelvic Pain  . Abdominal Pain   HPI  Catherine Weber is a 25-year-old female stenting with pelvic cramping, vaginal irritation and vaginal pruritus. Onset of symptoms was 3 days ago. Patient states she has a yeast infection. He states she is experiencing intermittent lower abdominal and pelvic cramping. He states that the symptoms have largely improved since onset. She is now also complaining of irritation and itching. She states that sitting in warm baths makes the area extremely and standing. Denies any vaginal discharge. She took AZO over-the-counter which did not improve her symptoms. Reports possibility of STD exposure. Denies fevers, chills, headache, lightheadedness, syncope, nausea, vomiting, diarrhea, dysuria, hematuria, flank pain or rash.  History reviewed. No pertinent past medical history. History reviewed. No pertinent past surgical history. No family history on file. Social History  Substance Use Topics  . Smoking status: Smoker, Current Status Unknown -- 1.00 packs/day  . Smokeless tobacco: None  . Alcohol Use: Yes     Comment: occasional    OB History    No data available     Review of Systems  All other systems reviewed and are negative.     Allergies  Review of patient's allergies indicates no known allergies.  Home Medications   Prior to Admission medications   Medication Sig Start Date End Date Taking? Authorizing Provider  naproxen (NAPROSYN) 500 MG tablet Take 500 mg by mouth 2 (two) times daily as needed for headache.   Yes Historical Provider, MD  metroNIDAZOLE (FLAGYL) 500 MG tablet Take 1 tablet (500 mg total) by mouth 2 (two) times daily. 10/29/15   Essica Kiker, PA-C   BP 119/68 mmHg  Pulse 97  Temp(Src) 98.6 F (37 C) (Oral)  Resp 16  SpO2 100%  LMP 09/23/2015 (Approximate) Physical  Exam  Constitutional: She appears well-developed and well-nourished. No distress.  Nontoxic-appearing  HENT:  Head: Normocephalic and atraumatic.  Eyes: Conjunctivae are normal. Right eye exhibits no discharge. Left eye exhibits no discharge. No scleral icterus.  Neck: Normal range of motion.  Cardiovascular: Normal rate, regular rhythm and normal heart sounds.   Pulmonary/Chest: Effort normal and breath sounds normal. No respiratory distress.  Abdominal: Soft. There is tenderness in the suprapubic area. There is no rigidity, no rebound and no guarding.  Genitourinary: There is no rash on the right labia. There is no rash on the left labia. Cervix exhibits no motion tenderness, no discharge and no friability. Right adnexum displays no tenderness and no fullness. Left adnexum displays no tenderness and no fullness. Vaginal discharge found.  Exam chaperoned by med tech. Moderate amount of thin, yellow discharge noted in vaginal canal. No discharge from the cervix. No cervical motion tenderness or adnexal fullness or tenderness.  Musculoskeletal: Normal range of motion.  Lymphadenopathy:       Right: No inguinal adenopathy present.       Left: No inguinal adenopathy present.  Neurological: She is alert. Coordination normal.  Skin: Skin is warm and dry.  Psychiatric: She has a normal mood and affect. Her behavior is normal.  Nursing note and vitals reviewed.   ED Course  Procedures (including critical care time) Labs Review Labs Reviewed  WET PREP, GENITAL - Abnormal; Notable for the following:    Clue Cells Wet Prep HPF POC PRESENT (*)  WBC, Wet Prep HPF POC MANY (*)    All other components within normal limits  COMPREHENSIVE METABOLIC PANEL - Abnormal; Notable for the following:    Potassium 3.3 (*)    Glucose, Bld 64 (*)    ALT 8 (*)    All other components within normal limits  CBC - Abnormal; Notable for the following:    Hemoglobin 11.4 (*)    HCT 35.5 (*)    All other  components within normal limits  URINALYSIS, ROUTINE W REFLEX MICROSCOPIC (NOT AT [facility]) - Abnormal; Notable for the following:    APPearance CLOUDY (*)    Ketones, ur 15 (*)    Leukocytes, UA SMALL (*)    All other components within normal limits  URINE MICROSCOPIC-ADD ON - Abnormal; Notable for the following:    Squamous Epithelial / LPF 6-30 (*)    Bacteria, UA FEW (*)    All other components within normal limits  LIPASE, BLOOD  I-STAT BETA HCG BLOOD, ED ([facility], [facility], [facility] ONLY)  GC/CHLAMYDIA PROBE AMP ([place]) NOT AT [facility]    Imaging Review No results found. I have personally reviewed and evaluated these images and lab results as part of my medical decision-making.   EKG Interpretation None      MDM   Final diagnoses:  BV (bacterial vaginosis)   25 year old female presenting with pelvic pain and irritation 3 days. Afebrile and hemodynamically stable. Abdomen is soft with tenderness in the suprapubic region. Vaginal discharge noted on pelvic exam. No CMT. No leukocytosis. Mild hypokalemia which was repleted in the emergency department. UA not suggestive of urinary tract infection. Wet prep positive for bacterial vaginosis. Patient is also requesting prophylactic treatment for STDs. Azithromycin and ceftriaxone given in emergency department. Patient understands that she has gonorrhea and chlamydia cultures pending and will receive a phone call if they come back positive. Patient is to follow-up with her OB/GYN as needed. Return precautions given in discharge paperwork and discussed with pt at bedside. Pt stable for discharge      Catherine Gip, PA-C 10/29/15 [place], DO 10/29/15 [phone]

## 2015-10-29 NOTE — ED Notes (Signed)
Pt in reports pelvic discomfort for past 3 days, rates 6/10. Denies urinary S/S, denies vaginal discharge.

## 2015-11-01 ENCOUNTER — Telehealth (HOSPITAL_BASED_OUTPATIENT_CLINIC_OR_DEPARTMENT_OTHER): Payer: Self-pay | Admitting: Emergency Medicine

## 2015-11-05 ENCOUNTER — Telehealth (HOSPITAL_BASED_OUTPATIENT_CLINIC_OR_DEPARTMENT_OTHER): Payer: Self-pay

## 2015-11-05 ENCOUNTER — Encounter (HOSPITAL_COMMUNITY): Payer: Self-pay | Admitting: *Deleted

## 2015-11-05 ENCOUNTER — Emergency Department (HOSPITAL_COMMUNITY)
Admission: EM | Admit: 2015-11-05 | Discharge: 2015-11-05 | Disposition: A | Discharge status: Home / Self Care | Payer: BLUE CROSS/BLUE SHIELD | Attending: Emergency Medicine | Admitting: Emergency Medicine

## 2015-11-05 DIAGNOSIS — A64 Unspecified sexually transmitted disease: Secondary | ICD-10-CM | POA: Diagnosis not present

## 2015-11-05 DIAGNOSIS — Z79899 Other long term (current) drug therapy: Secondary | ICD-10-CM | POA: Diagnosis not present

## 2015-11-05 DIAGNOSIS — J029 Acute pharyngitis, unspecified: Secondary | ICD-10-CM | POA: Diagnosis present

## 2015-11-05 LAB — RAPID STREP SCREEN (MED CTR MEBANE ONLY): Streptococcus, Group A Screen (Direct): NEGATIVE

## 2015-11-05 MED ORDER — ACETAMINOPHEN-CODEINE 120-12 MG/5ML PO SOLN: 10.0000 mL | ORAL | 0 refills | Status: DC | PRN

## 2015-11-05 MED ORDER — AMOXICILLIN 875 MG PO TABS: 875.0000 mg | ORAL_TABLET | Freq: Two times a day (BID) | ORAL | 0 refills | Status: DC

## 2015-11-05 NOTE — Discharge Instructions (Signed)
Return here as needed. Follow up with a primary doctor. °

## 2015-11-05 NOTE — ED Provider Notes (Signed)
[facility] DEPT Provider Note   CSN: [id number] Arrival date & time: 11/05/15  1119  First Provider Contact:  First MD Initiated Contact with Patient 11/05/15 1418     By signing my name below, I, [name], attest that this documentation has been prepared under the direction and in the presence of Dalia Heading, PA-C Electronically Signed: Ladene Artist, ED Scribe 11/05/2015 at 3:17 PM.   History   Chief Complaint Chief Complaint  Patient presents with  . SEXUALLY TRANSMITTED DISEASE    HPI Catherine Weber is a 25 y.o. female who presents to the Emergency Department complaining of sudden onset of 9/10, sore throat onset yesterday. Pt reports increased pain with swallowing. She was seen in the ED 1 week ago for gradually improving pelvic pain and abdominal cramping onset 3 days prior. She also reported vaginal itching and irritation at that visit. Pt was treated with Azithromycin and ceftriaxone for STD exposure at that visit and was informed today that she tested positive for chlamydia. She denies any other symptoms at this time. No known drug allergies.   The history is provided by the patient. No language interpreter was used.    History reviewed. No pertinent past medical history.  There are no active problems to display for this patient.   History reviewed. No pertinent surgical history.  OB History    No data available       Home Medications    Prior to Admission medications   Medication Sig Start Date End Date Taking? Authorizing Provider  metroNIDAZOLE (FLAGYL) 500 MG tablet Take 1 tablet (500 mg total) by mouth 2 (two) times daily. 10/29/15   Stevi Barrett, PA-C  naproxen (NAPROSYN) 500 MG tablet Take 500 mg by mouth 2 (two) times daily as needed for headache.    Historical Provider, MD    Family History History reviewed. No pertinent family history.  Social History Social History  Substance Use Topics  . Smoking status: Smoker, Current Status  Unknown    Packs/day: 1.00  . Smokeless tobacco: Not on file  . Alcohol use Yes     Comment: occasional     Allergies   Review of patient's allergies indicates no known allergies.   Review of Systems Review of Systems A complete 10 system review of systems was obtained and all systems are negative except as noted in the HPI and PMH.   Physical Exam Updated Vital Signs BP 116/69 (BP Location: Right Arm)   Pulse 73   Temp 99.1 F (37.3 C) (Oral)   Resp 18   LMP 09/23/2015 (Approximate)   SpO2 100%   Physical Exam  Constitutional: She is oriented to person, place, and time. She appears well-developed and well-nourished. No distress.  HENT:  Head: Normocephalic and atraumatic.  Mouth/Throat: Uvula is midline. Posterior oropharyngeal erythema (mild) present.  Uvula swollen  Eyes: Conjunctivae and EOM are normal.  Neck: Neck supple. No tracheal deviation present.  Cardiovascular: Normal rate.   Pulmonary/Chest: Effort normal. No respiratory distress.  Musculoskeletal: Normal range of motion.  Lymphadenopathy:    She has cervical adenopathy (anterior).  Neurological: She is alert and oriented to person, place, and time.  Skin: Skin is warm and dry.  Psychiatric: She has a normal mood and affect. Her behavior is normal.  Nursing note and vitals reviewed.  ED Treatments / Results  Labs (all labs ordered are listed, but only abnormal results are displayed) Labs Reviewed  RAPID STREP SCREEN (NOT AT [facility])  CULTURE, GROUP  A STREP [facility])   EKG  EKG Interpretation None      Radiology No results found.  Procedures Procedures (including critical care time) DIAGNOSTIC STUDIES: Oxygen Saturation is 100% on RA, normal by my interpretation.    COORDINATION OF CARE: 3:14 PM-Discussed treatment plan which includes strep screen with pt at bedside and pt agreed to plan.    Medications Ordered in ED Medications - No data to display   Initial Impression / Assessment and  Plan / ED Course  I have reviewed the triage vital signs and the nursing notes.  Pertinent labs & imaging results that were available during my care of the patient were reviewed by me and considered in my medical decision making (see chart for details).  Clinical Course  Comment By Time  Patient was treated with Rocephin and Zithromax on her previous visit Dalia Heading, PA-C 07/28 1416    Return here as needed.  Follow-up with the primary care doctor  Final Clinical Impressions(s) / ED Diagnoses   Final diagnoses:  None    New Prescriptions New Prescriptions   No medications on file     Dalia Heading, PA-C 11/05/15 [place], MD 11/06/15 2144

## 2015-11-05 NOTE — ED Triage Notes (Signed)
Pt reports finding out today that she tested + for chlamydia and needs treatment. Pt reports only symptom is sore throat.

## 2015-11-08 LAB — CULTURE, GROUP A STREP (THRC)

## 2016-02-23 ENCOUNTER — Emergency Department (HOSPITAL_COMMUNITY)
Admission: EM | Admit: 2016-02-23 | Discharge: 2016-02-24 | Disposition: A | Discharge status: Home / Self Care | Payer: BLUE CROSS/BLUE SHIELD | Attending: Emergency Medicine | Admitting: Emergency Medicine

## 2016-02-23 ENCOUNTER — Encounter (HOSPITAL_COMMUNITY): Payer: Self-pay | Admitting: *Deleted

## 2016-02-23 DIAGNOSIS — Z79899 Other long term (current) drug therapy: Secondary | ICD-10-CM | POA: Insufficient documentation

## 2016-02-23 DIAGNOSIS — F17200 Nicotine dependence, unspecified, uncomplicated: Secondary | ICD-10-CM | POA: Insufficient documentation

## 2016-02-23 DIAGNOSIS — G43009 Migraine without aura, not intractable, without status migrainosus: Secondary | ICD-10-CM

## 2016-02-23 HISTORY — DX: Migraine, unspecified, not intractable, without status migrainosus: G43.909

## 2016-02-23 NOTE — ED Triage Notes (Signed)
Patient presents with c/o migraine HA with N/V  History of the same

## 2016-02-24 LAB — URINALYSIS, ROUTINE W REFLEX MICROSCOPIC
Bilirubin Urine: NEGATIVE
GLUCOSE, UA: NEGATIVE mg/dL
Hgb urine dipstick: NEGATIVE
KETONES UR: NEGATIVE mg/dL
LEUKOCYTES UA: NEGATIVE
NITRITE: POSITIVE — AB
PH: 7.5 (ref 5.0–8.0)
Protein, ur: NEGATIVE mg/dL
SPECIFIC GRAVITY, URINE: 1.017 (ref 1.005–1.030)

## 2016-02-24 LAB — URINE MICROSCOPIC-ADD ON

## 2016-02-24 LAB — POC URINE PREG, ED: Preg Test, Ur: NEGATIVE

## 2016-02-24 MED ORDER — SODIUM CHLORIDE 0.9 % IV SOLN
Freq: Once | INTRAVENOUS | Status: AC
  Administered 2016-02-24: 01:00:00 via INTRAVENOUS

## 2016-02-24 MED ORDER — METOCLOPRAMIDE HCL 5 MG/ML IJ SOLN
10.0000 mg | Freq: Once | INTRAMUSCULAR | Status: AC
  Administered 2016-02-24: 10 mg via INTRAVENOUS
  Filled 2016-02-24: qty 2

## 2016-02-24 MED ORDER — DEXAMETHASONE SODIUM PHOSPHATE 10 MG/ML IJ SOLN
10.0000 mg | Freq: Once | INTRAMUSCULAR | Status: AC
  Administered 2016-02-24: 10 mg via INTRAVENOUS
  Filled 2016-02-24: qty 1

## 2016-02-24 MED ORDER — DIPHENHYDRAMINE HCL 50 MG/ML IJ SOLN
25.0000 mg | Freq: Once | INTRAMUSCULAR | Status: AC
  Administered 2016-02-24: 25 mg via INTRAVENOUS
  Filled 2016-02-24: qty 1

## 2016-02-24 NOTE — ED Notes (Signed)
See EDP assessment 

## 2016-02-24 NOTE — ED Provider Notes (Signed)
[facility] DEPT Provider Note   CSN: [id number] Arrival date & time: 02/23/16  2325     History   Chief Complaint Chief Complaint  Patient presents with  . Migraine  . Nausea    HPI Catherine Weber is a 25 y.o. female who presents   The history is provided by the patient. No language interpreter was used.  Migraine  This is a new problem. The current episode started 6 to 12 hours ago. The problem occurs constantly. The problem has been gradually worsening. Associated symptoms include abdominal pain and headaches. Pertinent negatives include no chest pain and no shortness of breath. Nothing relieves the symptoms. She has tried nothing for the symptoms.    Past Medical History:  Diagnosis Date  . Migraines     There are no active problems to display for this patient.   History reviewed. No pertinent surgical history.  OB History    No data available       Home Medications    Prior to Admission medications   Medication Sig Start Date End Date Taking? Authorizing Provider  acetaminophen-codeine 120-12 MG/5ML solution Take 10 mLs by mouth every 4 (four) hours as needed for moderate pain. 11/05/15   Dalia Heading, PA-C  amoxicillin (AMOXIL) 875 MG tablet Take 1 tablet (875 mg total) by mouth 2 (two) times daily. 11/05/15   Dalia Heading, PA-C  metroNIDAZOLE (FLAGYL) 500 MG tablet Take 1 tablet (500 mg total) by mouth 2 (two) times daily. 10/29/15   Stevi Barrett, PA-C  naproxen (NAPROSYN) 500 MG tablet Take 500 mg by mouth 2 (two) times daily as needed for headache.    Historical Provider, MD    Family History No family history on file.  Social History Social History  Substance Use Topics  . Smoking status: Smoker, Current Status Unknown    Packs/day: 1.00  . Smokeless tobacco: Never Used  . Alcohol use Yes     Comment: occasional      Allergies   Patient has no known allergies.   Review of Systems Review of Systems  Constitutional: Negative  for chills and fever.  HENT: Negative.   Eyes: Positive for photophobia.  Respiratory: Negative for cough, shortness of breath and wheezing.   Cardiovascular: Negative for chest pain and leg swelling.  Gastrointestinal: Positive for abdominal pain, nausea and vomiting. Negative for constipation and diarrhea.  Genitourinary: Negative for dysuria, frequency, urgency, vaginal bleeding and vaginal discharge.  Musculoskeletal: Positive for back pain.  Skin: Negative for rash.  Neurological: Positive for headaches. Negative for syncope and light-headedness.  Psychiatric/Behavioral: Negative for confusion. The patient is not nervous/anxious.      Physical Exam Updated Vital Signs BP 116/77   Pulse 65   Temp 98.2 F (36.8 C) (Oral)   Resp 16   Ht 5\' 4"  (1.626 m)   Wt 61.7 kg   LMP 01/19/2016   SpO2 100%   BMI 23.36 kg/m   Physical Exam  Constitutional: She is oriented to person, place, and time. She appears well-developed and well-nourished. No distress.  HENT:  Head: Normocephalic and atraumatic.  Right Ear: Tympanic membrane normal.  Left Ear: Tympanic membrane normal.  Nose: Nose normal.  Mouth/Throat: Uvula is midline, oropharynx is clear and moist and mucous membranes are normal.  Eyes: Conjunctivae and EOM are normal.  Neck: Normal range of motion. Neck supple.  Cardiovascular: Normal rate and regular rhythm.   Pulmonary/Chest: Effort normal. She has no wheezes. She has no rales.  Abdominal:  Soft. Bowel sounds are normal. She exhibits no mass. There is no tenderness.  Musculoskeletal: She exhibits no edema.  Radial and pedal pulses strong, adequate circulation, good touch sensation.  Neurological: She is alert and oriented to person, place, and time. She has normal strength. No cranial nerve deficit or sensory deficit. She displays a negative Romberg sign. Gait normal.  Reflex Scores:      Bicep reflexes are 2+ on the right side and 2+ on the left side.       Brachioradialis reflexes are 2+ on the right side and 2+ on the left side.      Patellar reflexes are 2+ on the right side and 2+ on the left side.      Achilles reflexes are 2+ on the right side and 2+ on the left side. Rapid alternating movement without difficulty. Stands on one foot without difficulty.  Psychiatric: She has a normal mood and affect. Her behavior is normal.     ED Treatments / Results  Labs (all labs ordered are listed, but only abnormal results are displayed) Labs Reviewed  URINALYSIS, ROUTINE W REFLEX MICROSCOPIC (NOT AT [facility]) - Abnormal; Notable for the following:       Result Value   APPearance TURBID (*)    Nitrite POSITIVE (*)    All other components within normal limits  URINE MICROSCOPIC-ADD ON - Abnormal; Notable for the following:    Squamous Epithelial / LPF 0-5 (*)    Bacteria, UA MANY (*)    All other components within normal limits  POC URINE PREG, ED    EKG  EKG Interpretation None       Radiology No results found.  Procedures Procedures (including critical care time)  Medications Ordered in ED Medications  0.9 %  sodium chloride infusion ( Intravenous Stopped 02/24/16 0209)  dexamethasone (DECADRON) injection 10 mg (10 mg Intravenous Given 02/24/16 0058)  metoCLOPramide (REGLAN) injection 10 mg (10 mg Intravenous Given 02/24/16 0058)  diphenhydrAMINE (BENADRYL) injection 25 mg (25 mg Intravenous Given 02/24/16 0058)     Initial Impression / Assessment and Plan / ED Course  I have reviewed the triage vital signs and the nursing notes.   Clinical Course   after IV hydration and medications in the ED patient feeling much better and taking PO fluids without n/v. Stable for d/c without neuro deficits. Discussed with the patient and all questioned fully answered. She will return if any problems arise.   Final Clinical Impressions(s) / ED Diagnoses   Final diagnoses:  Migraine without aura and without status migrainosus, not  intractable    New Prescriptions New Prescriptions   No medications on file     [facility], NP 02/24/16 0214    Lacretia Leigh, MD 02/24/16 [phone]

## 2016-02-24 NOTE — ED Notes (Signed)
EDP at bedside  

## 2016-02-27 LAB — URINE CULTURE

## 2016-02-28 ENCOUNTER — Telehealth (HOSPITAL_BASED_OUTPATIENT_CLINIC_OR_DEPARTMENT_OTHER): Payer: Self-pay | Admitting: Emergency Medicine

## 2016-02-28 NOTE — Progress Notes (Signed)
ED Antimicrobial Stewardship Positive Culture Follow Up   Catherine Weber is an 25 y.o. female who presented to [facility] on 02/23/2016 with a chief complaint of  Chief Complaint  Patient presents with  . Migraine  . Nausea    Recent Results (from the past 720 hour(s))  Urine culture     Status: Abnormal   Collection Time: 02/23/16 11:44 PM  Result Value Ref Range Status   Specimen Description URINE, RANDOM  Final   Special Requests ADDED [id number]  Final   Culture >=100,000 COLONIES/mL ESCHERICHIA COLI (A)  Final   Report Status 02/27/2016 FINAL  Final   Organism ID, Bacteria ESCHERICHIA COLI (A)  Final      Susceptibility   Escherichia coli - MIC*    AMPICILLIN >=32 RESISTANT Resistant     CEFAZOLIN <=4 SENSITIVE Sensitive     CEFTRIAXONE <=1 SENSITIVE Sensitive     CIPROFLOXACIN >=4 RESISTANT Resistant     GENTAMICIN <=1 SENSITIVE Sensitive     IMIPENEM <=0.25 SENSITIVE Sensitive     NITROFURANTOIN <=16 SENSITIVE Sensitive     TRIMETH/SULFA >=320 RESISTANT Resistant     AMPICILLIN/SULBACTAM 16 INTERMEDIATE Intermediate     PIP/TAZO <=4 SENSITIVE Sensitive     Extended ESBL NEGATIVE Sensitive     * >=100,000 COLONIES/mL ESCHERICHIA COLI    [x]  Discussed patient and culture results with PA. Patient is asymptomatic, therefore antibiotic treatment is not indicated at this time.  ED Provider: Harlene Ramus, PA-C  Belia Heman, PharmD PGY1 Pharmacy Resident [phone] (Pager) 02/28/2016 8:34 AM

## 2016-02-28 NOTE — Telephone Encounter (Signed)
Post ED Visit - Positive Culture Follow-up  Culture report reviewed by antimicrobial stewardship pharmacist:  []  Elenor Quinones, Pharm.D. []  Heide Guile, Pharm.D., BCPS []  Parks Neptune, Pharm.D. []  Alycia Rossetti, Pharm.D., BCPS []  [place], Pharm.D., BCPS, AAHIVP []  Legrand Como, Pharm.D., BCPS, AAHIVP []  Milus Glazier, Pharm.D. []  Stephens November, Pharm.D. Apryl Ouida Sills PharmD  Positive urine culture Treated with none, asymptomatic, organism sensitive to the same and no further patient follow-up is required at this time.  Hazle Nordmann 02/28/2016, 9:42 AM

## 2016-05-28 ENCOUNTER — Encounter (HOSPITAL_COMMUNITY): Payer: Self-pay | Admitting: Emergency Medicine

## 2016-05-28 DIAGNOSIS — Z87891 Personal history of nicotine dependence: Secondary | ICD-10-CM | POA: Insufficient documentation

## 2016-05-28 DIAGNOSIS — B9689 Other specified bacterial agents as the cause of diseases classified elsewhere: Secondary | ICD-10-CM | POA: Insufficient documentation

## 2016-05-28 DIAGNOSIS — N760 Acute vaginitis: Secondary | ICD-10-CM | POA: Insufficient documentation

## 2016-05-28 NOTE — ED Triage Notes (Signed)
Reports irregular menstrual cycle since 03/19/16.  Also c/o vaginal itching x 2 days.  Denies vaginal discharge.

## 2016-05-29 ENCOUNTER — Emergency Department (HOSPITAL_COMMUNITY)
Admission: EM | Admit: 2016-05-29 | Discharge: 2016-05-29 | Disposition: A | Discharge status: Home / Self Care | Payer: BLUE CROSS/BLUE SHIELD | Attending: Emergency Medicine | Admitting: Emergency Medicine

## 2016-05-29 DIAGNOSIS — N939 Abnormal uterine and vaginal bleeding, unspecified: Secondary | ICD-10-CM

## 2016-05-29 DIAGNOSIS — N760 Acute vaginitis: Secondary | ICD-10-CM

## 2016-05-29 DIAGNOSIS — B9689 Other specified bacterial agents as the cause of diseases classified elsewhere: Secondary | ICD-10-CM

## 2016-05-29 LAB — GC/CHLAMYDIA PROBE AMP (~~LOC~~) NOT AT ARMC
Chlamydia: NEGATIVE
Neisseria Gonorrhea: NEGATIVE

## 2016-05-29 LAB — CBC
HCT: 34.1 % — ABNORMAL LOW (ref 36.0–46.0)
Hemoglobin: 11.1 g/dL — ABNORMAL LOW (ref 12.0–15.0)
MCH: 28 pg (ref 26.0–34.0)
MCHC: 32.6 g/dL (ref 30.0–36.0)
MCV: 85.9 fL (ref 78.0–100.0)
PLATELETS: 168 10*3/uL (ref 150–400)
RBC: 3.97 MIL/uL (ref 3.87–5.11)
RDW: 12.9 % (ref 11.5–15.5)
WBC: 6.3 10*3/uL (ref 4.0–10.5)

## 2016-05-29 LAB — I-STAT BETA HCG BLOOD, ED (MC, WL, AP ONLY): I-stat hCG, quantitative: <5 m[IU]/mL (ref <5)

## 2016-05-29 LAB — WET PREP, GENITAL
SPERM: NONE SEEN
Trich, Wet Prep: NONE SEEN
Yeast Wet Prep HPF POC: NONE SEEN

## 2016-05-29 MED ORDER — METRONIDAZOLE 500 MG PO TABS: 500.0000 mg | ORAL_TABLET | Freq: Two times a day (BID) | ORAL | 0 refills | Status: DC

## 2016-05-29 NOTE — ED Provider Notes (Signed)
[facility] DEPT Provider Note   CSN: [id number] Arrival date & time: 05/28/16  2334     History   Chief Complaint Chief Complaint  Patient presents with  . Menstrual Problem    HPI Catherine Weber is a 26 y.o. female who presents to the ED with amenorrhea. She reports her LMP was over 2 months ago. She reports having spotting but no normal period. She denies pain, n/v or other problems. She has had one pregnancy that was terminated. She does not have an OB/GYN physician. She has been with her current sex partner x 2 years and has unprotected sex and no birth control.   The history is provided by the patient. No language interpreter was used.    Past Medical History:  Diagnosis Date  . Migraines     There are no active problems to display for this patient.   History reviewed. No pertinent surgical history.  OB History    No data available       Home Medications    Prior to Admission medications   Medication Sig Start Date End Date Taking? Authorizing Provider  acetaminophen-codeine 120-12 MG/5ML solution Take 10 mLs by mouth every 4 (four) hours as needed for moderate pain. 11/05/15   Dalia Heading, PA-C  amoxicillin (AMOXIL) 875 MG tablet Take 1 tablet (875 mg total) by mouth 2 (two) times daily. 11/05/15   Dalia Heading, PA-C  metroNIDAZOLE (FLAGYL) 500 MG tablet Take 1 tablet (500 mg total) by mouth 2 (two) times daily. 05/29/16   [place], NP  naproxen (NAPROSYN) 500 MG tablet Take 500 mg by mouth 2 (two) times daily as needed for headache.    Historical Provider, MD    Family History No family history on file.  Social History Social History  Substance Use Topics  . Smoking status: Former Smoker    Packs/day: 1.00  . Smokeless tobacco: Never Used  . Alcohol use Yes     Comment: occasional      Allergies   Patient has no known allergies.   Review of Systems Review of Systems  Constitutional: Negative for chills and fever.    Gastrointestinal: Negative for abdominal pain, nausea and vomiting.  Genitourinary: Positive for vaginal discharge. Negative for dysuria, frequency and urgency. Vaginal bleeding: spotting.     Physical Exam Updated Vital Signs BP 97/55 (BP Location: Right Arm)   Pulse (!) 51   Temp 97.9 F (36.6 C) (Oral)   Resp 14   Ht 5\' 5"  (1.651 m)   Wt 61 kg   LMP 03/19/2016   SpO2 100%   BMI 22.36 kg/m   Physical Exam  Constitutional: She is oriented to person, place, and time. She appears well-developed and well-nourished. No distress.  HENT:  Head: Normocephalic.  Eyes: EOM are normal.  Neck: Neck supple.  Cardiovascular: Normal rate.   Pulmonary/Chest: Effort normal.  Abdominal: Soft. There is no tenderness.  Genitourinary:  Genitourinary Comments: External genitalia without lesions, dark brown malodorous d/c vaginal vault. No CMT, no adnexal tenderness, uterus without palpable enlargement.   Musculoskeletal: Normal range of motion.  Neurological: She is alert and oriented to person, place, and time. No cranial nerve deficit.  Skin: Skin is warm and dry.  Psychiatric: She has a normal mood and affect.  Nursing note and vitals reviewed.    ED Treatments / Results  Labs (all labs ordered are listed, but only abnormal results are displayed) Labs Reviewed  WET PREP, GENITAL - Abnormal; Notable  for the following:       Result Value   Clue Cells Wet Prep HPF POC PRESENT (*)    WBC, Wet Prep HPF POC FEW (*)    All other components within normal limits  CBC - Abnormal; Notable for the following:    Hemoglobin 11.1 (*)    HCT 34.1 (*)    All other components within normal limits  I-STAT BETA HCG BLOOD, ED ([facility], [facility], [facility] ONLY)  GC/CHLAMYDIA PROBE AMP ([place]) NOT AT [facility]    Radiology No results found.  Procedures Procedures (including critical care time)  Medications Ordered in ED Medications - No data to display   Initial Impression / Assessment and Plan / ED  Course  I have reviewed the triage vital signs and the nursing notes.  Pertinent lab results that were available during my care of the patient were reviewed by me and considered in my medical decision making (see chart for details).   Final Clinical Impressions(s) / ED Diagnoses  26 y.o. female with irregular menses stable for d/c with negative Bhcg. She has not abdominal pain or signs of PID. Will treat for BV and she will f/u with the health department for Pap smear and Women's Health screening exam.  Final diagnoses:  BV (bacterial vaginosis)  Abnormal vaginal bleeding    New Prescriptions New Prescriptions   METRONIDAZOLE (FLAGYL) 500 MG TABLET    Take 1 tablet (500 mg total) by mouth 2 (two) times daily.     [address] [place], NP 05/29/16 [place], MD 05/29/16 [phone]

## 2016-05-29 NOTE — Discharge Instructions (Signed)
Your pregnancy blood test was negative. Your wet prep shows that you do have bacterial vaginosis. We are treating you with an antibiotics for that. Follow up with the health department for a pap smear and further evaluation of your irregular bleeding.

## 2016-06-02 ENCOUNTER — Emergency Department (HOSPITAL_COMMUNITY)
Admission: EM | Admit: 2016-06-02 | Discharge: 2016-06-02 | Disposition: A | Discharge status: Home / Self Care | Payer: BLUE CROSS/BLUE SHIELD | Attending: Emergency Medicine | Admitting: Emergency Medicine

## 2016-06-02 ENCOUNTER — Encounter (HOSPITAL_COMMUNITY): Payer: Self-pay

## 2016-06-02 DIAGNOSIS — T63304A Toxic effect of unspecified spider venom, undetermined, initial encounter: Secondary | ICD-10-CM

## 2016-06-02 DIAGNOSIS — Z87891 Personal history of nicotine dependence: Secondary | ICD-10-CM | POA: Insufficient documentation

## 2016-06-02 NOTE — ED Triage Notes (Signed)
Per Pt, Pt noted a blister on her leg yesterday. She reports that she popped the blister and noted a spider today in her room. She reports believing the spot is a spider bite.

## 2016-06-02 NOTE — ED Provider Notes (Signed)
[facility] DEPT Provider Note   CSN: [id number] Arrival date & time: 06/02/16  1551     History   Chief Complaint Chief Complaint  Patient presents with  . Insect Bite    HPI Catherine Weber is a 26 y.o. female.  HPI  Pt reports spider bite to anterior left ankle yesterday. Noticed redness and swelling. Now reports swelling improving. No fevers. No spreading redness. No systemic symptoms   Past Medical History:  Diagnosis Date  . Migraines     There are no active problems to display for this patient.   History reviewed. No pertinent surgical history.  OB History    No data available       Home Medications    Prior to Admission medications   Medication Sig Start Date End Date Taking? Authorizing Provider  acetaminophen-codeine 120-12 MG/5ML solution Take 10 mLs by mouth every 4 (four) hours as needed for moderate pain. 11/05/15   Dalia Heading, PA-C  amoxicillin (AMOXIL) 875 MG tablet Take 1 tablet (875 mg total) by mouth 2 (two) times daily. 11/05/15   Dalia Heading, PA-C  metroNIDAZOLE (FLAGYL) 500 MG tablet Take 1 tablet (500 mg total) by mouth 2 (two) times daily. 05/29/16   [place], NP  naproxen (NAPROSYN) 500 MG tablet Take 500 mg by mouth 2 (two) times daily as needed for headache.    Historical Provider, MD    Family History No family history on file.  Social History Social History  Substance Use Topics  . Smoking status: Former Smoker    Packs/day: 1.00  . Smokeless tobacco: Never Used  . Alcohol use Yes     Comment: occasional      Allergies   Patient has no known allergies.   Review of Systems Review of Systems  All other systems reviewed and are negative.    Physical Exam Updated Vital Signs BP 111/72   Pulse 77   Temp 98.8 F (37.1 C) (Oral)   Resp 16   Ht 5\' 4"  (1.626 m)   Wt 134 lb (60.8 kg)   SpO2 100%   BMI 23.00 kg/m   Physical Exam  Constitutional: She is oriented to person, place, and time. She  appears well-developed and well-nourished.  HENT:  Head: Normocephalic.  Eyes: EOM are normal.  Neck: Normal range of motion.  Pulmonary/Chest: Effort normal.  Abdominal: She exhibits no distension.  Musculoskeletal:  Anterior left ankle with small area or erythema. No blister. No fluctuance  Neurological: She is alert and oriented to person, place, and time.  Psychiatric: She has a normal mood and affect.  Nursing note and vitals reviewed.    ED Treatments / Results  Labs (all labs ordered are listed, but only abnormal results are displayed) Labs Reviewed - No data to display  EKG  EKG Interpretation None       Radiology No results found.  Procedures Procedures (including critical care time)  Medications Ordered in ED Medications - No data to display   Initial Impression / Assessment and Plan / ED Course  I have reviewed the triage vital signs and the nursing notes.  Pertinent labs & imaging results that were available during my care of the patient were reviewed by me and considered in my medical decision making (see chart for details).     Improving from picture shown to me on her phone. Topical abx. No indication for systemic meds. Infection warning given. Encouraged to return to ER for worsening symptoms  Final Clinical Impressions(s) / ED Diagnoses   Final diagnoses:  Spider bite wound, undetermined intent, initial encounter    New Prescriptions New Prescriptions   No medications on file     Jola Schmidt, MD 06/02/16 1616

## 2016-06-02 NOTE — Discharge Instructions (Signed)
Apply neosporin twice a day. Return for any worsening symptoms

## 2016-06-02 NOTE — ED Notes (Signed)
Pt seen in Triage Room 1 by Venora Maples MD, pt ambulatory, Venora Maples MD reviewed discharge instructions and gave the pt her AVS

## 2016-06-08 ENCOUNTER — Emergency Department (HOSPITAL_COMMUNITY)
Admission: EM | Admit: 2016-06-08 | Discharge: 2016-06-10 | Disposition: A | Discharge status: Home / Self Care | Payer: BLUE CROSS/BLUE SHIELD | Attending: Emergency Medicine | Admitting: Emergency Medicine

## 2016-06-08 ENCOUNTER — Encounter (HOSPITAL_COMMUNITY): Payer: Self-pay

## 2016-06-08 DIAGNOSIS — Z79899 Other long term (current) drug therapy: Secondary | ICD-10-CM | POA: Insufficient documentation

## 2016-06-08 DIAGNOSIS — F1410 Cocaine abuse, uncomplicated: Secondary | ICD-10-CM | POA: Insufficient documentation

## 2016-06-08 DIAGNOSIS — F309 Manic episode, unspecified: Secondary | ICD-10-CM

## 2016-06-08 DIAGNOSIS — F1910 Other psychoactive substance abuse, uncomplicated: Secondary | ICD-10-CM

## 2016-06-08 DIAGNOSIS — Z87891 Personal history of nicotine dependence: Secondary | ICD-10-CM | POA: Insufficient documentation

## 2016-06-08 DIAGNOSIS — R45851 Suicidal ideations: Secondary | ICD-10-CM

## 2016-06-08 LAB — COMPREHENSIVE METABOLIC PANEL
ALT: 12 U/L — AB (ref 14–54)
AST: 22 U/L (ref 15–41)
Albumin: 4.4 g/dL (ref 3.5–5.0)
Alkaline Phosphatase: 39 U/L (ref 38–126)
Anion gap: 11 (ref 5–15)
BILIRUBIN TOTAL: 0.5 mg/dL (ref 0.3–1.2)
CO2: 21 mmol/L — ABNORMAL LOW (ref 22–32)
CREATININE: 0.85 mg/dL (ref 0.44–1.00)
Calcium: 9.3 mg/dL (ref 8.9–10.3)
Chloride: 105 mmol/L (ref 101–111)
GFR calc Af Amer: >60 mL/min (ref >60)
Glucose, Bld: 102 mg/dL — ABNORMAL HIGH (ref 65–99)
Potassium: 3.5 mmol/L (ref 3.5–5.1)
Sodium: 137 mmol/L (ref 135–145)
TOTAL PROTEIN: 7.3 g/dL (ref 6.5–8.1)

## 2016-06-08 LAB — CBC
HCT: 37.1 % (ref 36.0–46.0)
Hemoglobin: 12.5 g/dL (ref 12.0–15.0)
MCH: 28.7 pg (ref 26.0–34.0)
MCHC: 33.7 g/dL (ref 30.0–36.0)
MCV: 85.3 fL (ref 78.0–100.0)
PLATELETS: 150 10*3/uL (ref 150–400)
RBC: 4.35 MIL/uL (ref 3.87–5.11)
RDW: 13 % (ref 11.5–15.5)
WBC: 6 10*3/uL (ref 4.0–10.5)

## 2016-06-08 LAB — ETHANOL

## 2016-06-08 LAB — I-STAT BETA HCG BLOOD, ED (MC, WL, AP ONLY): I-stat hCG, quantitative: <5 m[IU]/mL (ref <5)

## 2016-06-08 LAB — ACETAMINOPHEN LEVEL: Acetaminophen (Tylenol), Serum: <10 ug/mL — ABNORMAL LOW (ref 10–30)

## 2016-06-08 LAB — SALICYLATE LEVEL: Salicylate Lvl: <7 mg/dL (ref 2.8–30.0)

## 2016-06-08 MED ORDER — LORAZEPAM 1 MG PO TABS
1.0000 mg | ORAL_TABLET | Freq: Once | ORAL | Status: DC
  Filled 2016-06-08: qty 1

## 2016-06-08 MED ORDER — ZIPRASIDONE MESYLATE 20 MG IM SOLR
10.0000 mg | Freq: Once | INTRAMUSCULAR | Status: AC
  Administered 2016-06-08: 10 mg via INTRAMUSCULAR
  Filled 2016-06-08: qty 20

## 2016-06-08 MED ORDER — STERILE WATER FOR INJECTION IJ SOLN
INTRAMUSCULAR | Status: AC
  Administered 2016-06-09: 07:00:00
  Filled 2016-06-08: qty 10

## 2016-06-08 MED ORDER — LORAZEPAM 2 MG/ML IJ SOLN
1.0000 mg | Freq: Once | INTRAMUSCULAR | Status: AC
  Administered 2016-06-08: 1 mg via INTRAMUSCULAR
  Filled 2016-06-08: qty 1

## 2016-06-08 NOTE — ED Notes (Signed)
Sitter at bedside, no needs voiced and no problems at this time.

## 2016-06-08 NOTE — ED Notes (Signed)
Dr Laneta Simmers and [place], [place] speaking with pt's mother in the hall.  Paper work is being completed for IVC.

## 2016-06-08 NOTE — ED Notes (Signed)
Pt asleep at this time

## 2016-06-08 NOTE — ED Triage Notes (Addendum)
Pt brought in by mother requesting detox. Pt initially not speaking but bouncing her legs around swinging her arms around. Pt alert, no distress noted. Pt states "I've been smoking too much crack and need to stop."

## 2016-06-08 NOTE — ED Notes (Signed)
Per tech, pt came in mother's zip-up robe which she took with her when she left.

## 2016-06-08 NOTE — ED Notes (Addendum)
Security remains at room until mothers arrival.

## 2016-06-08 NOTE — ED Notes (Signed)
Patient aware we need urine sample for drug screen, refuses to give sample.
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
## 2016-06-08 NOTE — Discharge Instructions (Signed)
Read the information below.  You may return to the Emergency Department at any time for worsening condition or any new symptoms that concern you.  If you feel like you a threat to yourself or others, call 911 and return to the ED immediately for a recheck.

## 2016-06-08 NOTE — ED Notes (Signed)
Patient intermittenely screaming out and crying in between taking bites of her dinner. Sitter remains at bedside. [employer] at door.

## 2016-06-08 NOTE — ED Triage Notes (Signed)
After eating Pt rolled on to stomach and was quiet.

## 2016-06-08 NOTE — ED Triage Notes (Signed)
TTS done 

## 2016-06-08 NOTE — Progress Notes (Signed)
Received call from [facility] stating pt awaiting TTS evaluation, currently accompanied by mother who must leave ED soon and wants to relay collateral information prior to leaving.  CSW spoke with mother Catherine Weber) via ED phone. She states pt lives in an apartment with roommate. Works for [employer]." Has hx of depression at age 26, does not have much information re: treatment at that time, stating, "I think they wanted to put her on medications." States pt has used marijuana and alcohol in the past, unsure of details of use, "I don't live with her so I don't really know if she still does and if it's social use or what." States, "She started acting strange yesterday. Talking but not making sense at all. She went to pick up her brother from work and he noticed right away that something was wrong." States "this is the first time we've ever seen her like this." No OP services.

## 2016-06-08 NOTE — ED Notes (Signed)
Pt seems to have calmed down; [employer] and Security is no longer at bedside; Pt has sitter at bedside;

## 2016-06-08 NOTE — ED Notes (Signed)
Per staffing sitter to arrive at 3 pm.

## 2016-06-08 NOTE — ED Notes (Signed)
PA Emily at the bedside.  

## 2016-06-08 NOTE — ED Notes (Signed)
When asked, pt reports thoughts of harming herself. Denies a plan. Pt reports that, " I need help. I have had this depression. I need crack. I wish I could have finished hair school so I knew how to do my hair." Pt is restless and moving often.

## 2016-06-08 NOTE — ED Notes (Signed)
Tele Psych monitor set up at bedside.

## 2016-06-08 NOTE — ED Notes (Addendum)
[name] - mother - cell [phone]  Provided mother with passcode of [phone]

## 2016-06-08 NOTE — ED Notes (Signed)
Pt not willingly following commands at this time, refusing help from staff, walking around in hallway naked and not wanting to go into her room. Security called. Dr. Alberteen Sam came to bedside to talk to patient. Pt stated she had no thoughts of hurting herself and dr Laneta Simmers stated she would be up for discharge. This RN called pts mother and mother reports she would come to pick up her daughter "I will be there in a few."

## 2016-06-08 NOTE — ED Notes (Signed)
Patient urinated in bed, given clean scrubs and assisted sitter in cleaning and changing sheets on bed.  Patient back in bed.

## 2016-06-08 NOTE — ED Triage Notes (Signed)
PT laughing and jumping out bed to pull emergency call bell on wall.

## 2016-06-08 NOTE — ED Provider Notes (Signed)
[facility] DEPT Provider Note   CSN: [id number] Arrival date & time: 06/08/16  [id number]     History   Chief Complaint Chief Complaint  Patient presents with  . Drug Problem    HPI Catherine Weber is a 26 y.o. female.  HPI   Pt presents requesting help.  States she uses "crack and crack."   States she "asked my people to bring me" and states she "needs help."  She does not respond directly when asked questions and does not answer when asked if suicidal.  Level V caveat for psychiatric disorder vs intoxication  Past Medical History:  Diagnosis Date  . Migraines     There are no active problems to display for this patient.   History reviewed. No pertinent surgical history.  OB History    No data available       Home Medications    Prior to Admission medications   Medication Sig Start Date End Date Taking? Authorizing Provider  acetaminophen-codeine 120-12 MG/5ML solution Take 10 mLs by mouth every 4 (four) hours as needed for moderate pain. 11/05/15   Dalia Heading, PA-C  amoxicillin (AMOXIL) 875 MG tablet Take 1 tablet (875 mg total) by mouth 2 (two) times daily. 11/05/15   Dalia Heading, PA-C  metroNIDAZOLE (FLAGYL) 500 MG tablet Take 1 tablet (500 mg total) by mouth 2 (two) times daily. 05/29/16   [place], NP  naproxen (NAPROSYN) 500 MG tablet Take 500 mg by mouth 2 (two) times daily as needed for headache.    Historical Provider, MD    Family History History reviewed. No pertinent family history.  Social History Social History  Substance Use Topics  . Smoking status: Former Smoker    Packs/day: 1.00  . Smokeless tobacco: Never Used  . Alcohol use Yes     Comment: occasional      Allergies   Patient has no known allergies.   Review of Systems Review of Systems  Unable to perform ROS: Psychiatric disorder     Physical Exam Updated Vital Signs BP 113/72 (BP Location: Right Arm)   Pulse 62   Temp 98.6 F (37 C) (Oral)   Resp 16    SpO2 99%   Physical Exam  Constitutional: She appears well-developed and well-nourished. No distress.  HENT:  Head: Normocephalic and atraumatic.  Neck: Neck supple.  Cardiovascular: Normal rate and regular rhythm.   Pulmonary/Chest: Effort normal and breath sounds normal. No respiratory distress. She has no wheezes. She has no rales.  Abdominal: Soft. She exhibits no distension. There is no tenderness. There is no rebound and no guarding.  Neurological: She is alert.  Jerking arms and legs around in the bed.  Follows commands occasionally.   Skin: She is not diaphoretic.  Nursing note and vitals reviewed.    ED Treatments / Results  Labs (all labs ordered are listed, but only abnormal results are displayed) Labs Reviewed  COMPREHENSIVE METABOLIC PANEL - Abnormal; Notable for the following:       Result Value   CO2 21 (*)    Glucose, Bld 102 (*)    BUN <5 (*)    ALT 12 (*)    All other components within normal limits  ACETAMINOPHEN LEVEL - Abnormal; Notable for the following:    Acetaminophen (Tylenol), Serum <10 (*)    All other components within normal limits  ETHANOL  SALICYLATE LEVEL  CBC  RAPID URINE DRUG SCREEN, HOSP PERFORMED  I-STAT BETA HCG BLOOD, ED ([facility],  [facility], [facility] ONLY)    EKG  EKG Interpretation None       Radiology No results found.  Procedures Procedures (including critical care time)  Medications Ordered in ED Medications  LORazepam (ATIVAN) tablet 1 mg (1 mg Oral Refused 06/08/16 1145)     Initial Impression / Assessment and Plan / ED Course  I have reviewed the triage vital signs and the nursing notes.  Pertinent labs & imaging results that were available during my care of the patient were reviewed by me and considered in my medical decision making (see chart for details).    Pt came to ED requesting help for crack use but being very evasive and odd in her interactions.  She exhibited a lot of hyperkinetic movement, and made many statements  that did not make sense.  For example, when asked how I could help her she held her foot in the air and said "Look!" and when I asked if it was her tattoo or her foot was bothering her she started laughing hysterically. Initially it seemed that patient was simply intoxicated.  Her mother came to ED and stated she has been with her since last night and she has not been acting like herself, she has been starting sentences and stopping them, not making sense when she talks, telling her she loved her, thanking her, apologizing, and seems very out of character.  Given this new information, there is concern the patient may be manic.  Pt has not produced urine for a drug screen yet.  Pt and patient's mother also seen by Dr Laneta Simmers who filed IVC paperwork.  Anticipating TTS evaluation.  Pt under IVC.    Final Clinical Impressions(s) / ED Diagnoses   Final diagnoses:  Substance abuse  Suicidal ideation  Mania [facility])    New Prescriptions New Prescriptions   No medications on file     Clayton Bibles, PA-C 06/08/16 1509    Leo Grosser, MD 06/08/16 1726

## 2016-06-08 NOTE — ED Triage Notes (Signed)
Pt continue to make high shrilled voice. And jumping on bed.

## 2016-06-08 NOTE — ED Triage Notes (Signed)
Sitter reported pt said she wanted to kill herself. PT Is IVC'd

## 2016-06-08 NOTE — ED Notes (Signed)
Patient out of bed taking scrubs off and attempting to walk out in hall.  [employer] helped patient back to bed.  Encouraged patient to put scrubs back on, patient compliant at this time. Resting on bed with eyes shut.

## 2016-06-08 NOTE — BH Assessment (Signed)
Tele Assessment Note   Catherine Weber is an 26 y.o. female. Pt would not answer assessment questions. Pt was agitated and angry. Pt stated that she did not want to complete the assessment. This writer attempted to obtain collateral contact with the Pt's mother Catherine [phone] but there was no answer.  This Probation officer obtained collateral information from [employer] notes.  Per RN: "Pt brought in by mother requesting detox. Pt initially not speaking but bouncing her legs around swinging her arms around. Pt alert, no distress noted. Pt states "I've been smoking too much crack and need to stop."  Per Prudy Feeler, SW: "CSW spoke with mother Catherine Weber [phone]) via ED phone. She states pt lives in an apartment with roommate. Works for [employer]." Has hx of depression at age 79, does not have much information re: treatment at that time, stating, "I think they wanted to put her on medications." States pt has used marijuana and alcohol in the past, unsure of details of use, "I don't live with her so I don't really know if she still does and if it's social use or what." States, "She started acting strange yesterday. Talking but not making sense at all. She went to pick up her brother from work and he noticed right away that something was wrong." States "this is the first time we've ever seen her like this." No OP services. 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 Migraines     History reviewed. No pertinent surgical history.  Family History: History reviewed. No pertinent family history.  Social History:  reports that she has quit smoking. She smoked 1.00 pack per day. She has never used smokeless tobacco. She reports that she drinks alcohol. She reports that she uses drugs.  Additional Social History:  Alcohol / Drug Use Pain Medications: Please see Mar Prescriptions: Please see Mar Over the Counter: Please see Mar History of  alcohol / drug use?: Yes Longest period of sobriety (when/how long): unknown Substance #1 Name of Substance 1: crack cocaine 1 - Age of First Use: unknown 1 - Amount (size/oz): unknown 1 - Frequency: unknown 1 - Duration: unknown  CIWA: CIWA-Ar BP: 113/72 Pulse Rate: 62 COWS:    PATIENT STRENGTHS: (choose at least two) Communication skills Supportive family/friends  Allergies: No Known Allergies  Home Medications:  (Not in a hospital admission)  OB/GYN Status:  No LMP recorded. Patient is not currently having periods (Reason: Irregular Periods).  General Assessment Data Location of Assessment: [facility] ED TTS Assessment: In system Is this a Tele or Face-to-Face Assessment?: Tele Assessment Is this an Initial Assessment or a Re-assessment for this encounter?: Initial Assessment Marital status: Single Maiden name: NA Is patient pregnant?: No Pregnancy Status: No Living Arrangements: Other (Comment) (unknown) Can pt return to current living arrangement?: Yes Admission Status: Voluntary Is patient capable of signing voluntary admission?: Yes Referral Source: Self/Family/Friend Insurance type: [place] Living Arrangements: Other (Comment) (unknown) Legal Guardian: Other: (self) Name of Psychiatrist: NA Name of Therapist: NA  Education Status Is patient currently in school?: No Current Grade: NA Highest grade of school patient has completed: NA Name of school: NA Contact person: NA  Risk to self with the past 6 months Suicidal Ideation: Yes-Currently Present (Pt reports to ED staff) Has patient been a risk to self within the past 6 months prior to admission? : No Suicidal Intent:  No Has patient had any suicidal intent within the past 6 months prior to admission? : No Is patient at risk for suicide?: No Suicidal Plan?: No Has patient had any suicidal plan within the past 6 months prior to admission? : No Access to Means: No What has been your use  of drugs/alcohol within the last 12 months?: crack cocaine Previous Attempts/Gestures: No How many times?: 0 Other Self Harm Risks: NA Triggers for Past Attempts: None known Intentional Self Injurious Behavior: None Family Suicide History: No Recent stressful life event(s): Other (Comment) (SA) Persecutory voices/beliefs?: No Depression Symptoms:  (Pt will not report) Substance abuse history and/or treatment for substance abuse?: Yes Suicide prevention information given to non-admitted patients: Not applicable  Risk to Others within the past 6 months Homicidal Ideation: No Does patient have any lifetime risk of violence toward others beyond the six months prior to admission? : No Thoughts of Harm to Others: No Current Homicidal Intent: No Current Homicidal Plan: No Access to Homicidal Means: No Identified Victim: NA History of harm to others?: No Assessment of Violence: None Noted Violent Behavior Description: NA Does patient have access to weapons?: No Criminal Charges Pending?: No Does patient have a court date: No Is patient on probation?: Unknown  Psychosis Hallucinations: None noted Delusions: None noted  Mental Status Report Appearance/Hygiene: Other (Comment) (Pt covered herself with sheets) Eye Contact: Fair Motor Activity: Freedom of movement Speech: Rapid Level of Consciousness: Alert Mood: Angry, Suspicious Affect: Angry Anxiety Level: Minimal Thought Processes: Relevant Judgement: Impaired Orientation: Unable to assess Obsessive Compulsive Thoughts/Behaviors: Unable to Assess  Cognitive Functioning Concentration: Unable to Assess Memory: Unable to Assess IQ: Average Insight: Poor Impulse Control: Poor Appetite: Fair Weight Loss: 0 Weight Gain: 0 Sleep: Unable to Assess Total Hours of Sleep: 5 Vegetative Symptoms: None  ADLScreening [facility] Assessment Services) Patient's cognitive ability adequate to safely complete daily activities?: Yes Patient  able to express need for assistance with ADLs?: Yes Independently performs ADLs?: Yes (appropriate for developmental age)  Prior Inpatient Therapy Prior Inpatient Therapy: Yes Prior Therapy Dates: unknown Prior Therapy Facilty/Provider(s): unknown Reason for Treatment: unknown  Prior Outpatient Therapy Prior Outpatient Therapy: Yes Prior Therapy Dates: unknown Prior Therapy Facilty/Provider(s): unknown Reason for Treatment: unknown Does patient have an ACCT team?: Unknown Does patient have Intensive In-House Services?  : Unknown Does patient have [facility] services? : Unknown Does patient have P4CC services?: Unknown  ADL Screening (condition at time of admission) Patient's cognitive ability adequate to safely complete daily activities?: Yes Is the patient deaf or have difficulty hearing?: No Does the patient have difficulty seeing, even when wearing glasses/contacts?: No Does the patient have difficulty concentrating, remembering, or making decisions?: No Patient able to express need for assistance with ADLs?: Yes Does the patient have difficulty dressing or bathing?: No Independently performs ADLs?: Yes (appropriate for developmental age) Does the patient have difficulty walking or climbing stairs?: No Weakness of Legs: None Weakness of Arms/Hands: None       Abuse/Neglect Assessment (Assessment to be complete while patient is alone) Physical Abuse: Denies Verbal Abuse: Denies Sexual Abuse: Denies Exploitation of patient/patient's resources: Denies Self-Neglect: Denies     Regulatory affairs officer (For Healthcare) Does Patient Have a Medical Advance Directive?: No    Additional Information 1:1 In Past 12 Months?: No CIRT Risk: No Elopement Risk: No Does patient have medical clearance?: Yes     Disposition:  Disposition Initial Assessment Completed for this Encounter: Yes Disposition of Patient: Other dispositions Other disposition(s): Other  (  Comment)  Connery Shiffler D 06/08/2016 4:13 PM

## 2016-06-09 MED ORDER — DIPHENHYDRAMINE HCL 50 MG/ML IJ SOLN
25.0000 mg | Freq: Once | INTRAMUSCULAR | Status: AC
  Administered 2016-06-09: 25 mg via INTRAMUSCULAR
  Filled 2016-06-09: qty 1

## 2016-06-09 MED ORDER — HALOPERIDOL LACTATE 5 MG/ML IJ SOLN
5.0000 mg | Freq: Once | INTRAMUSCULAR | Status: AC
  Administered 2016-06-09: 5 mg via INTRAMUSCULAR
  Filled 2016-06-09: qty 1

## 2016-06-09 NOTE — ED Notes (Signed)
Meal tray ordered 

## 2016-06-09 NOTE — ED Notes (Signed)
Pt is now awake and states she has urinated on her self; pt asked to change in to clean scrubs and bed linen to be changed; pt refused; pt is lying bed shaking the bed and calling out to the sitter continuously;

## 2016-06-09 NOTE — ED Notes (Signed)
Umbilical piercing still in place at beginning of shift.  Piercing removed and placed in plastic container with patient's belongings.

## 2016-06-09 NOTE — ED Notes (Signed)
A regular diet was ordered for lunch.

## 2016-06-09 NOTE — Consult Note (Signed)
  Per Dr Dwyane Dee:   The following medication recommendations are being made for patient to help with agitation:  Zyprexa 5 mg BID PO  Tele assessment and tele psych consults have been attempted x 3 with little to no participation from Pt due to psychotic behaviors being exhibited, which have resulted in Pt having to receive Geodon.   Recommend TTS to seek inpatient placement when medically cleared. Will continue to monitor patient's progress and response to medication therapy.    Ethelene Hal, MSN, FNP 06/09/2016  1235 pm

## 2016-06-09 NOTE — ED Notes (Signed)
Pt awake at this time.  Requested water and cookies.  Stated she could not eat food.

## 2016-06-09 NOTE — ED Notes (Signed)
Security called to bedside; pt agitated and continues to yell out; RN has tried to ask pt what she needs but pt just states she does not know; Pt continues to lie in soiled scrubs and bedding; Staff has tried to assist in cleaning pt but pt has refused;

## 2016-06-09 NOTE — ED Notes (Signed)
Patient sleeping. Chest rise and fall noted

## 2016-06-09 NOTE — ED Notes (Signed)
Pts call light was placed back in room because pt wanted to watch TV.  Advised pt that sitter was going to have to hold it and she was not allowed to touch same.  Sitter was told to sit in room with pt and hold cord.  Pt WAS NOT ALLOWED TO TOUCH REMOTE OR CORD.

## 2016-06-09 NOTE — Progress Notes (Signed)
06/09/16  Referrals faxed to: [facility], [place], [facility], [place], [facility], [place], [place], [place], [place], [place], [place], [place], [place] T. Judi Cong, MSW, [facility]position [phone]

## 2016-06-09 NOTE — ED Notes (Signed)
Patient tore pillow apart and wrapped strips of the shredded pillow around her neck tightly.  Shredded pillow removed from room, patient became combative and refused to release strips from around her neck, holding them tightly in her hands.  As I removed each strip from around her neck she grabbed another and pulled it around her neck.  After all remnants of the pillow had been removed, the patient started crying and kicking the bed rails repeatedly.  MD aware.

## 2016-06-10 LAB — RAPID URINE DRUG SCREEN, HOSP PERFORMED
Amphetamines: NOT DETECTED
BARBITURATES: NOT DETECTED
BENZODIAZEPINES: POSITIVE — AB
Cocaine: NOT DETECTED
Opiates: NOT DETECTED
Tetrahydrocannabinol: POSITIVE — AB

## 2016-06-10 NOTE — ED Notes (Signed)
Pt signed "No Harm Contract" - copy given to pt - copy sent to Medical Records.

## 2016-06-10 NOTE — ED Notes (Signed)
Picked up pts belongings from security, pts belongings from security with Becky(RN)

## 2016-06-10 NOTE — ED Notes (Signed)
IVC papers rescinded - copy faxed to Clerk of Court, copy sent to Medical Records, and original placed in folder for Magistrate. 

## 2016-06-10 NOTE — ED Notes (Signed)
Pt spoke w/her mother on phone - has no way to come get pt.

## 2016-06-10 NOTE — ED Notes (Signed)
Pt asleep at this time

## 2016-06-10 NOTE — ED Notes (Signed)
Pt's belly ring noted in locked lower cabinet in room in labeled specimen cup - given to pt along w/pt's cell phone from security. States she did not have any clothing. Is attempting to call someone for ride from ED. Offered bus pass.

## 2016-06-10 NOTE — ED Provider Notes (Signed)
Notified by TTS that patient has been psychiatrically cleared for discharge. 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 On my assessment, patient is calm, cooperative. She denies any suicidal ideation, homicidal ideation, or auditory or visual hallucinations. She is goal oriented. Will provide with outpatient resources. IVC reversed as per TTS recommendation.   Duffy Bruce, MD 06/10/16 1007

## 2016-06-10 NOTE — BH Assessment (Signed)
Reassessment:  Patient denies SI, HI, and AVH's. Patient initially presented to [facility] ED 06/25/2016 requesting detox. Patient was noted to have limited speech, swinging her arms, thoughts of harming self, restless, refusing help from staff, uncooperative with staff, and walking in the hallway naked. Today patient is calm and cooperative. She is pleasant. She denies crack cocaine use. Sts, "I do smoke marijuana regularly and I think someone put something in it". Patient stating that she feels embarrassed about her actions over the past 2 days. She apologized stating, "I'm so sorry I behaved that way". Pt is dressed in scrubs, alert, oriented to name, date, location, and situation.Normal speech and normal behavior. Eye contact is good. Pt's mood is appropriate and affect is congruent with mood. Thought process is appropriate.   Pt does not appear to be responding to internal stimuli or experiencing delusional thought content. Writer discussed patient's clinicals with provider/Laurie Park, NP. Patient is psych cleared and Charlann Noss, NP is ok with discharging patient home to follow up with outpatient substance abuse referrals. Writer updated patient's nurse/Becky, RNP and EDP/Dr. Myrene Buddy with patient's discharge recommendations.

## 2016-07-26 ENCOUNTER — Encounter: Payer: Self-pay | Admitting: Emergency Medicine

## 2016-07-26 ENCOUNTER — Emergency Department (HOSPITAL_COMMUNITY)
Admission: EM | Admit: 2016-07-26 | Discharge: 2016-07-26 | Discharge status: Against Medical Advice | Payer: BLUE CROSS/BLUE SHIELD | Attending: Emergency Medicine | Admitting: Emergency Medicine

## 2016-07-26 DIAGNOSIS — L509 Urticaria, unspecified: Secondary | ICD-10-CM | POA: Insufficient documentation

## 2016-07-26 DIAGNOSIS — Z87891 Personal history of nicotine dependence: Secondary | ICD-10-CM | POA: Insufficient documentation

## 2016-07-26 DIAGNOSIS — Z5321 Procedure and treatment not carried out due to patient leaving prior to being seen by health care provider: Secondary | ICD-10-CM | POA: Insufficient documentation

## 2016-07-26 DIAGNOSIS — Z79899 Other long term (current) drug therapy: Secondary | ICD-10-CM | POA: Insufficient documentation

## 2016-07-26 NOTE — ED Triage Notes (Signed)
Pt states that she started breaking out in hives on her arms and neck approx. 1 hour ago. This occurred earlier this week earlier. Alert and oriented. Airway intact.

## 2016-07-26 NOTE — ED Notes (Signed)
Dr.  Martin Majestic in the room and patient was not in the room.

## 2016-12-10 ENCOUNTER — Encounter (HOSPITAL_COMMUNITY): Payer: Self-pay | Admitting: *Deleted

## 2016-12-10 ENCOUNTER — Emergency Department (HOSPITAL_COMMUNITY)
Admission: EM | Admit: 2016-12-10 | Discharge: 2016-12-10 | Disposition: A | Discharge status: Home / Self Care | Payer: BLUE CROSS/BLUE SHIELD | Attending: Emergency Medicine | Admitting: Emergency Medicine

## 2016-12-10 DIAGNOSIS — Z5321 Procedure and treatment not carried out due to patient leaving prior to being seen by health care provider: Secondary | ICD-10-CM | POA: Insufficient documentation

## 2016-12-10 DIAGNOSIS — R102 Pelvic and perineal pain: Secondary | ICD-10-CM | POA: Insufficient documentation

## 2016-12-10 NOTE — ED Triage Notes (Signed)
Patient is alert and oriented x4.  She is being seen for vaginal pain that has been on going for a week.  Patient states that she was seen by her PCP and given antibiotics a week ago and the discomfort has not resolved

## 2016-12-10 NOTE — ED Triage Notes (Signed)
Pt was called to treatment room for re-evaluation. Did not respond to call.

## 2017-01-30 ENCOUNTER — Encounter (HOSPITAL_COMMUNITY): Payer: Self-pay

## 2017-01-30 ENCOUNTER — Emergency Department (HOSPITAL_COMMUNITY)
Admission: EM | Admit: 2017-01-30 | Discharge: 2017-01-30 | Disposition: A | Discharge status: Home / Self Care | Payer: BLUE CROSS/BLUE SHIELD | Attending: Emergency Medicine | Admitting: Emergency Medicine

## 2017-01-30 DIAGNOSIS — Z87891 Personal history of nicotine dependence: Secondary | ICD-10-CM | POA: Insufficient documentation

## 2017-01-30 DIAGNOSIS — Z79899 Other long term (current) drug therapy: Secondary | ICD-10-CM | POA: Insufficient documentation

## 2017-01-30 DIAGNOSIS — N3000 Acute cystitis without hematuria: Secondary | ICD-10-CM | POA: Insufficient documentation

## 2017-01-30 DIAGNOSIS — N760 Acute vaginitis: Secondary | ICD-10-CM | POA: Insufficient documentation

## 2017-01-30 DIAGNOSIS — B9689 Other specified bacterial agents as the cause of diseases classified elsewhere: Secondary | ICD-10-CM

## 2017-01-30 LAB — URINALYSIS, ROUTINE W REFLEX MICROSCOPIC
Bilirubin Urine: NEGATIVE
GLUCOSE, UA: NEGATIVE mg/dL
HGB URINE DIPSTICK: NEGATIVE
KETONES UR: NEGATIVE mg/dL
NITRITE: POSITIVE — AB
PH: 7 (ref 5.0–8.0)
Protein, ur: 30 mg/dL — AB
Specific Gravity, Urine: 1.021 (ref 1.005–1.030)

## 2017-01-30 LAB — WET PREP, GENITAL
Sperm: NONE SEEN
TRICH WET PREP: NONE SEEN
YEAST WET PREP: NONE SEEN

## 2017-01-30 LAB — POC URINE PREG, ED: Preg Test, Ur: NEGATIVE

## 2017-01-30 MED ORDER — CEPHALEXIN 500 MG PO CAPS: 1000.0000 mg | ORAL_CAPSULE | Freq: Two times a day (BID) | ORAL | 0 refills | Status: AC

## 2017-01-30 MED ORDER — METRONIDAZOLE 500 MG PO TABS: 500.0000 mg | ORAL_TABLET | Freq: Two times a day (BID) | ORAL | 0 refills | Status: DC

## 2017-01-30 NOTE — ED Triage Notes (Signed)
Pt reports vaginal itching after period ended on oct 22.  Wants to be checked for STD.

## 2017-01-30 NOTE — ED Provider Notes (Signed)
[facility] EMERGENCY DEPARTMENT Provider Note   CSN: [id number] Arrival date & time: 01/30/17  1003     History   Chief Complaint Chief Complaint  Patient presents with  . S74.5    HPI Catherine Weber is a 26 y.o. female presenting with external vaginal itching without discharge since yesterday. Her LMP was yesterday. She endorses single partner and does not use condoms but is fairly certain her partner is not his only, he denies sx.  She denies fevers, chills, abdominal or pelvic pain except for menstrual cramping, now resolved.  She denies rash or lesions of her genitals. Denies dysuria, hematuria or increased urinary frequency.  The history is provided by the patient.    Past Medical History:  Diagnosis Date  . Migraines     There are no active problems to display for this patient.   History reviewed. No pertinent surgical history.  OB History    No data available       Home Medications    Prior to Admission medications   Medication Sig Start Date End Date Taking? Authorizing Provider  acetaminophen-codeine 120-12 MG/5ML solution Take 10 mLs by mouth every 4 (four) hours as needed for moderate pain. 11/05/15   Lawyer, Harrell Gave, PA-C  amoxicillin (AMOXIL) 875 MG tablet Take 1 tablet (875 mg total) by mouth 2 (two) times daily. 11/05/15   Lawyer, Harrell Gave, PA-C  cephALEXin (KEFLEX) 500 MG capsule Take 2 capsules (1,000 mg total) by mouth 2 (two) times daily. 01/30/17 02/04/17  Evalee Jefferson, PA-C  metroNIDAZOLE (FLAGYL) 500 MG tablet Take 1 tablet (500 mg total) by mouth 2 (two) times daily. 01/30/17   Evalee Jefferson, PA-C  naproxen (NAPROSYN) 500 MG tablet Take 500 mg by mouth 2 (two) times daily as needed for headache.    [provider]    Family History No family history on file.  Social History Social History  Substance Use Topics  . Smoking status: Former Smoker    Packs/day: 1.00  . Smokeless tobacco: Never Used  . Alcohol use Yes   Comment: occasional      Allergies   Patient has no known allergies.   Review of Systems Review of Systems  Constitutional: Negative for fever.  HENT: Negative.   Eyes: Negative.   Respiratory: Negative.   Cardiovascular: Negative.   Gastrointestinal: Negative for abdominal pain, nausea and vomiting.  Genitourinary: Negative.  Negative for difficulty urinating, dysuria, flank pain, genital sores, hematuria, menstrual problem, pelvic pain, vaginal discharge and vaginal pain.       Negative except as mentioned in HPI.   Musculoskeletal: Negative for arthralgias, joint swelling and neck pain.  Skin: Negative.  Negative for rash and wound.  Neurological: Negative for dizziness, weakness, light-headedness, numbness and headaches.  Psychiatric/Behavioral: Negative.      Physical Exam Updated Vital Signs BP 102/69 (BP Location: Right Arm)   Pulse 76   Temp 98.4 F (36.9 C) (Oral)   Resp 18   Ht 5\' 4"  (1.626 m)   Wt 59 kg (130 lb)   LMP 01/25/2017   SpO2 100%   BMI 22.31 kg/m   Physical Exam  Constitutional: She appears well-developed and well-nourished.  HENT:  Head: Normocephalic and atraumatic.  Eyes: Conjunctivae are normal.  Cardiovascular: Normal rate, regular rhythm, normal heart sounds and intact distal pulses.   Pulmonary/Chest: Effort normal and breath sounds normal. She has no wheezes.  Abdominal: Soft. Bowel sounds are normal. There is no tenderness.  Genitourinary:  Genitourinary Comments:  Pt refused  Musculoskeletal: Normal range of motion.  Neurological: She is alert.  Skin: Skin is warm and dry.  Psychiatric: She has a normal mood and affect.  Nursing note and vitals reviewed.    ED Treatments / Results  Labs (all labs ordered are listed, but only abnormal results are displayed) Labs Reviewed  WET PREP, GENITAL - Abnormal; Notable for the following:       Result Value   Clue Cells Wet Prep HPF POC PRESENT (*)    WBC, Wet Prep HPF POC FEW (*)      All other components within normal limits  URINALYSIS, ROUTINE W REFLEX MICROSCOPIC - Abnormal; Notable for the following:    APPearance HAZY (*)    Protein, ur 30 (*)    Nitrite POSITIVE (*)    Leukocytes, UA MODERATE (*)    Bacteria, UA MANY (*)    Squamous Epithelial / LPF 0-5 (*)    All other components within normal limits  URINE CULTURE  POC URINE PREG, ED  GC/CHLAMYDIA PROBE AMP ([place]) NOT AT [facility]    EKG  EKG Interpretation None       Radiology No results found.  Procedures Procedures (including critical care time)  Medications Ordered in ED Medications - No data to display   Initial Impression / Assessment and Plan / ED Course  I have reviewed the triage vital signs and the nursing notes.  Pertinent labs & imaging results that were available during my care of the patient were reviewed by me and considered in my medical decision making (see chart for details).     Pt refusing speculum exam, asking to swab herself "like they do at the health department".  Advised pt that this will be a less than accurate result and I will not trust results for gc/chlamydia.  Pt understands and accepts this less than accurate collection method.  Labs resulting with uti, cx ordered. BV per wet prep.  Pt advised f/u for any persistent or new sx, referral given for primary care.  Final Clinical Impressions(s) / ED Diagnoses   Final diagnoses:  Acute cystitis without hematuria  Bacterial vaginosis    New Prescriptions New Prescriptions   CEPHALEXIN (KEFLEX) 500 MG CAPSULE    Take 2 capsules (1,000 mg total) by mouth 2 (two) times daily.   METRONIDAZOLE (FLAGYL) 500 MG TABLET    Take 1 tablet (500 mg total) by mouth 2 (two) times daily.     Evalee Jefferson, PA-C 01/30/17 [place], [place], DO 02/02/17 [phone]

## 2017-01-30 NOTE — ED Notes (Signed)
Pt refuses pelvic exam.  Pt obtained own specimens.

## 2017-01-31 LAB — GC/CHLAMYDIA PROBE AMP (~~LOC~~) NOT AT ARMC
CHLAMYDIA, DNA PROBE: NEGATIVE
Neisseria Gonorrhea: NEGATIVE

## 2017-02-02 LAB — URINE CULTURE

## 2017-02-03 ENCOUNTER — Telehealth: Payer: Self-pay

## 2017-02-03 NOTE — Telephone Encounter (Signed)
Post ED Visit - Positive Culture Follow-up  Culture report reviewed by antimicrobial stewardship pharmacist:  []  Elenor Quinones, Pharm.D. []  Heide Guile, Pharm.D., BCPS AQ-ID []  Parks Neptune, Pharm.D., BCPS []  Alycia Rossetti, Pharm.D., BCPS []  [place], Pharm.D., BCPS, AAHIVP []  Legrand Como, Pharm.D., BCPS, AAHIVP []  Salome Arnt, PharmD, BCPS []  Dimitri Ped, PharmD, BCPS []  Vincenza Hews, PharmD, BCPS [facility] Pharm D Positive urine culture Treated with Cephalexin, organism sensitive to the same and no further patient follow-up is required at this time.  Genia Del 02/03/2017, 12:14 PM

## 2017-03-06 ENCOUNTER — Encounter (HOSPITAL_COMMUNITY): Payer: Self-pay | Admitting: Emergency Medicine

## 2017-03-06 ENCOUNTER — Emergency Department (HOSPITAL_COMMUNITY)
Admission: EM | Admit: 2017-03-06 | Discharge: 2017-03-06 | Disposition: A | Discharge status: Home / Self Care | Payer: Self-pay | Attending: Emergency Medicine | Admitting: Emergency Medicine

## 2017-03-06 DIAGNOSIS — B9689 Other specified bacterial agents as the cause of diseases classified elsewhere: Secondary | ICD-10-CM

## 2017-03-06 DIAGNOSIS — L292 Pruritus vulvae: Secondary | ICD-10-CM | POA: Insufficient documentation

## 2017-03-06 DIAGNOSIS — N390 Urinary tract infection, site not specified: Secondary | ICD-10-CM

## 2017-03-06 DIAGNOSIS — N760 Acute vaginitis: Secondary | ICD-10-CM | POA: Insufficient documentation

## 2017-03-06 DIAGNOSIS — A5901 Trichomonal vulvovaginitis: Secondary | ICD-10-CM

## 2017-03-06 DIAGNOSIS — N898 Other specified noninflammatory disorders of vagina: Secondary | ICD-10-CM

## 2017-03-06 DIAGNOSIS — Z711 Person with feared health complaint in whom no diagnosis is made: Secondary | ICD-10-CM

## 2017-03-06 DIAGNOSIS — Z87891 Personal history of nicotine dependence: Secondary | ICD-10-CM | POA: Insufficient documentation

## 2017-03-06 LAB — WET PREP, GENITAL
Sperm: NONE SEEN
YEAST WET PREP: NONE SEEN

## 2017-03-06 LAB — URINALYSIS, ROUTINE W REFLEX MICROSCOPIC
Bilirubin Urine: NEGATIVE
Glucose, UA: NEGATIVE mg/dL
Hgb urine dipstick: NEGATIVE
KETONES UR: NEGATIVE mg/dL
Nitrite: POSITIVE — AB
PROTEIN: 30 mg/dL — AB
Specific Gravity, Urine: 1.02 (ref 1.005–1.030)
pH: 7 (ref 5.0–8.0)

## 2017-03-06 LAB — POC URINE PREG, ED: Preg Test, Ur: NEGATIVE

## 2017-03-06 MED ORDER — CEFTRIAXONE SODIUM 250 MG IJ SOLR
250.0000 mg | Freq: Once | INTRAMUSCULAR | Status: AC
  Administered 2017-03-06: 250 mg via INTRAMUSCULAR
  Filled 2017-03-06: qty 250

## 2017-03-06 MED ORDER — METRONIDAZOLE 500 MG PO TABS
2000.0000 mg | ORAL_TABLET | Freq: Once | ORAL | Status: AC
  Administered 2017-03-06: 2000 mg via ORAL
  Filled 2017-03-06: qty 4

## 2017-03-06 MED ORDER — AZITHROMYCIN 250 MG PO TABS
1000.0000 mg | ORAL_TABLET | Freq: Once | ORAL | Status: AC
  Administered 2017-03-06: 1000 mg via ORAL
  Filled 2017-03-06: qty 4

## 2017-03-06 MED ORDER — FLUCONAZOLE 150 MG PO TABS
150.0000 mg | ORAL_TABLET | Freq: Once | ORAL | Status: AC
  Administered 2017-03-06: 150 mg via ORAL
  Filled 2017-03-06: qty 1

## 2017-03-06 MED ORDER — SULFAMETHOXAZOLE-TRIMETHOPRIM 800-160 MG PO TABS: 1.0000 | ORAL_TABLET | Freq: Two times a day (BID) | ORAL | 0 refills | Status: DC

## 2017-03-06 MED ORDER — STERILE WATER FOR INJECTION IJ SOLN
INTRAMUSCULAR | Status: AC
  Administered 2017-03-06: 10 mL
  Filled 2017-03-06: qty 10

## 2017-03-06 MED ORDER — FLUCONAZOLE 150 MG PO TABS: 150.0000 mg | ORAL_TABLET | Freq: Once | ORAL | 0 refills | Status: AC

## 2017-03-06 MED ORDER — METRONIDAZOLE 500 MG PO TABS: 500.0000 mg | ORAL_TABLET | Freq: Two times a day (BID) | ORAL | 0 refills | Status: DC

## 2017-03-06 NOTE — ED Notes (Signed)
Pt reports she recently had a blood test done for HIV and states she "does not need the blood tests done."

## 2017-03-06 NOTE — ED Triage Notes (Signed)
Patient here from home with complaints of vaginal itching. Requesting STD check "check everything". Denies exposure, denies discharge. LMP 11/16.

## 2017-03-06 NOTE — ED Provider Notes (Signed)
[facility] DEPT Provider Note   CSN: [id number] Arrival date & time: 03/06/17  1234     History   Chief Complaint Chief Complaint  Patient presents with  . Vaginal Itching    STD check    HPI Catherine Weber is a 26 y.o. female with a PMHx of migraines, who presents to the ED requesting an STD check.  Patient states that approximately 3 weeks ago she began having some vaginal itching and discharge, which she initially thought was because she was going to start her menstrual cycle.  Her menses began on 02/24/17 and her symptoms stopped during her menstrual cycle, however when she finished her menses the symptoms returned.  She states that the vaginal discharge is "the same as during ovulation but smells like sulfur", describing it as a thin whitish discharge.  She also reports mildly malodorous urine.  She denies any changes in feminine products or douching.  She is sexually active with one female partner, unprotected.  She is requesting STD check, and states that she has tried to contact the health department but has been unable to get an appointment there so she came here for her STD check. She denies fevers, chills, CP, SOB, abd pain, nausea/vomiting, diarrhea/constipation, obstipation, melena, hematochezia, hematuria, dysuria, urinary frequency, vaginal bleeding, genital sores, myalgias, arthralgias, numbness, tingling, focal weakness, or any other complaints at this time. Denies recent travel, sick contacts, suspicious food intake, EtOH use, NSAID use, or recent abx.    The history is provided by the patient and medical records. No language interpreter was used.  Vaginal Itching  This is a new problem. The current episode started more than 1 week ago. The problem occurs daily. The problem has not changed since onset.Pertinent negatives include no chest pain, no abdominal pain and no shortness of breath. Nothing aggravates the symptoms. Nothing relieves the  symptoms. She has tried nothing for the symptoms. The treatment provided no relief.    Past Medical History:  Diagnosis Date  . Migraines     There are no active problems to display for this patient.   History reviewed. No pertinent surgical history.  OB History    No data available       Home Medications    Prior to Admission medications   Medication Sig Start Date End Date Taking? Authorizing Provider  acetaminophen-codeine 120-12 MG/5ML solution Take 10 mLs by mouth every 4 (four) hours as needed for moderate pain. 11/05/15   Lawyer, Harrell Gave, PA-C  amoxicillin (AMOXIL) 875 MG tablet Take 1 tablet (875 mg total) by mouth 2 (two) times daily. 11/05/15   Lawyer, Harrell Gave, PA-C  metroNIDAZOLE (FLAGYL) 500 MG tablet Take 1 tablet (500 mg total) by mouth 2 (two) times daily. 01/30/17   Evalee Jefferson, PA-C  naproxen (NAPROSYN) 500 MG tablet Take 500 mg by mouth 2 (two) times daily as needed for headache.    [provider]    Family History No family history on file.  Social History Social History   Tobacco Use  . Smoking status: Former Smoker    Packs/day: 1.00  . Smokeless tobacco: Never Used  Substance Use Topics  . Alcohol use: Yes    Comment: occasional   . Drug use: No     Allergies   Patient has no known allergies.   Review of Systems Review of Systems  Constitutional: Negative for chills and fever.  Respiratory: Negative for shortness of breath.   Cardiovascular: Negative for chest pain.  Gastrointestinal: Negative for abdominal pain, blood in stool, constipation, diarrhea, nausea and vomiting.  Genitourinary: Positive for vaginal discharge and vaginal pain (itching). Negative for dysuria, frequency, genital sores, hematuria and vaginal bleeding.       +malodorous urine  Musculoskeletal: Negative for arthralgias and myalgias.  Skin: Negative for color change.  Allergic/Immunologic: Negative for immunocompromised state.  Neurological:  Negative for weakness and numbness.  Psychiatric/Behavioral: Negative for confusion.   All other systems reviewed and are negative for acute change except as noted in the HPI.    Physical Exam Updated Vital Signs BP 117/73 (BP Location: Left Arm)   Pulse 85   Temp 98.8 F (37.1 C) (Oral)   Resp 18   LMP 02/23/2017   SpO2 100%   Physical Exam  Constitutional: She is oriented to person, place, and time. Vital signs are normal. She appears well-developed and well-nourished.  Non-toxic appearance. No distress.  Afebrile, nontoxic, NAD  HENT:  Head: Normocephalic and atraumatic.  Mouth/Throat: Oropharynx is clear and moist and mucous membranes are normal.  Eyes: Conjunctivae and EOM are normal. Right eye exhibits no discharge. Left eye exhibits no discharge.  Neck: Normal range of motion. Neck supple.  Cardiovascular: Normal rate, regular rhythm, normal heart sounds and intact distal pulses. Exam reveals no gallop and no friction rub.  No murmur heard. Pulmonary/Chest: Effort normal and breath sounds normal. No respiratory distress. She has no decreased breath sounds. She has no wheezes. She has no rhonchi. She has no rales.  Abdominal: Soft. Normal appearance and bowel sounds are normal. She exhibits no distension. There is no tenderness. There is no rigidity, no rebound, no guarding, no CVA tenderness, no tenderness at McBurney's point and negative Murphy's sign.  Soft, NTND, +BS throughout, no r/g/r, neg murphy's, neg mcburney's, no CVA TTP   Genitourinary:  Genitourinary Comments: Pt declined, opted for blind vaginal swab  Musculoskeletal: Normal range of motion.  Neurological: She is alert and oriented to person, place, and time. She has normal strength. No sensory deficit.  Skin: Skin is warm, dry and intact. No rash noted.  Psychiatric: She has a normal mood and affect.  Nursing note and vitals reviewed.    ED Treatments / Results  Labs (all labs ordered are listed, but only  abnormal results are displayed) Labs Reviewed  WET PREP, GENITAL - Abnormal; Notable for the following components:      Result Value   Trich, Wet Prep PRESENT (*)    Clue Cells Wet Prep HPF POC PRESENT (*)    WBC, Wet Prep HPF POC MANY (*)    All other components within normal limits  URINALYSIS, ROUTINE W REFLEX MICROSCOPIC - Abnormal; Notable for the following components:   APPearance HAZY (*)    Protein, ur 30 (*)    Nitrite POSITIVE (*)    Leukocytes, UA SMALL (*)    Bacteria, UA MANY (*)    Squamous Epithelial / LPF 0-5 (*)    All other components within normal limits  POC URINE PREG, ED  GC/CHLAMYDIA PROBE AMP ([place]) NOT AT [facility]    EKG  EKG Interpretation None       Radiology No results found.  Procedures Procedures (including critical care time)  Medications Ordered in ED Medications  azithromycin (ZITHROMAX) tablet 1,000 mg (1,000 mg Oral Given 03/06/17 1706)    And  cefTRIAXone (ROCEPHIN) injection 250 mg (250 mg Intramuscular Given 03/06/17 1709)    And  metroNIDAZOLE (FLAGYL) tablet 2,000 mg (2,000 mg  Oral Given 03/06/17 1706)  fluconazole (DIFLUCAN) tablet 150 mg (150 mg Oral Given 03/06/17 1705)  sterile water (preservative free) injection (10 mLs  Given 03/06/17 1709)     Initial Impression / Assessment and Plan / ED Course  I have reviewed the triage vital signs and the nursing notes.  Pertinent labs & imaging results that were available during my care of the patient were reviewed by me and considered in my medical decision making (see chart for details).     26 y.o. female here requesting STD check, had some vaginal discharge and itching prior to her menses ~3wks ago, then resolved during her menses (started 02/24/17), and then started again after finishing her menses. Reports some malodorous urine, and strong odor to discharge. No abdominal tenderness on exam. Pt declined pelvic exam, opted to perform blind vaginal swabs instead, which I  think is reasonable given the nonemergent nature of her complaints and lack of concerning abdominal exam findings. Will likely empirically treat for yeast and GC/CT, will await her Upreg and U/A tests as well as wet prep results to see if any other treatment is needed. Will reassess shortly.   4:52 PM Pt declining HIV/RPR testing. Upreg neg. U/A with +nitrites and many bacteria, TNTC WBCs, will cover for UTI as well given her malodorous urine complaint. Wet prep showing +trichomonas and +clue cells as well as many WBCs. Empirically treated for GC/CT, and will treat for trich here as well as possible yeast infection. Will send home with abx for UTI, as well as second dose of diflucan to take after finishing abx, and flagyl course for BV. Advised avoidance of harsh soaps or douching. Discussed abstinence x10 days. F/up with health dept for future STD concerns. Safe sex encouraged, and discussed having partners tested and treated before re-engaging in intercourse after the 10 day abstinence period.  Advised f/up with women's outpatient clinic to establish OBGYN care, and for recheck of symptoms. I explained the diagnosis and have given explicit precautions to return to the ER including for any other new or worsening symptoms. The patient understands and accepts the medical plan as it's been dictated and I have answered their questions. Discharge instructions concerning home care and prescriptions have been given. The patient is STABLE and is discharged to home in good condition.    Final Clinical Impressions(s) / ED Diagnoses   Final diagnoses:  Vaginal itching  Vaginal irritation  Concern about STD in female without diagnosis  Trichomonas vaginalis (TV) infection  Lower urinary tract infectious disease  Bacterial vaginosis    ED Discharge Orders        Ordered    metroNIDAZOLE (FLAGYL) 500 MG tablet  2 times daily     03/06/17 1637    sulfamethoxazole-trimethoprim (BACTRIM DS,SEPTRA DS) 800-160 MG  tablet  2 times daily     03/06/17 1637    fluconazole (DIFLUCAN) 150 MG tablet   Once     03/06/17 [address] [employer], [place] 03/06/17 1713    Forde Dandy, MD 03/09/17 1251

## 2017-03-06 NOTE — ED Notes (Addendum)
Pt provided applesauce at her request to take the pills with Pt educated on the use of condoms and encouraged pt to inform her partners to get tested.  Pt is tearful and reports being angry at her significant other.

## 2017-03-06 NOTE — Discharge Instructions (Addendum)
You have been treated for vaginal yeast infection today in the ER due to your symptoms. Avoid harsh soaps or feminine products, avoid douching.   You were found to have trichomonas, for which you were treated in the ER today.   You also have bacterial vaginosis, take flagyl as directed until completed, starting tomorrow since you received your first dose today. DO NOT DRINK ALCOHOL WHILE TAKING FLAGYL.   Additionally it appears you have a urinary tract infection, take antibiotics as directed until completed.   AFTER finishing the antibiotics, take the second dose of diflucan in order to fully cover you for a vaginal yeast infection.   You have also been treated for gonorrhea and chlamydia in the ER but the hospital will call you if lab is positive. NO SEXUAL INTERCOURSE FOR AT LEAST 10 DAYS AFTER TODAY'S VISIT, THIS WILL INVALIDATE YOUR TREATMENT HERE. DO NOT ENGAGE IN SEXUAL ACTIVITY UNTIL YOU FIND OUT ABOUT YOUR RESULTS AND HAVE PARTNERS TESTED AND TREATED. ALL PARTNERS MUST BE TESTED AND TREATED FOR STD'S. ALWAYS USE CONDOMS WHEN ENGAGING IN INTERCOURSE. Follow up with [facility] STD clinic for future STD concerns or screenings.   Follow up with the women's outpatient clinic in 1 week for recheck of symptoms and to establish OBGYN care. Go to the [facility] hospital emergency department (called the [facility]) for any changes or worsening symptoms.

## 2017-03-07 LAB — GC/CHLAMYDIA PROBE AMP (~~LOC~~) NOT AT ARMC
CHLAMYDIA, DNA PROBE: NEGATIVE
Neisseria Gonorrhea: NEGATIVE

## 2017-03-10 ENCOUNTER — Encounter (HOSPITAL_COMMUNITY): Payer: Self-pay | Admitting: Nurse Practitioner

## 2017-03-10 ENCOUNTER — Other Ambulatory Visit: Payer: Self-pay

## 2017-03-10 ENCOUNTER — Inpatient Hospital Stay (HOSPITAL_COMMUNITY)
Admission: AD | Admit: 2017-03-10 | Discharge: 2017-03-10 | Disposition: A | Discharge status: Home / Self Care | Payer: Self-pay | Source: Ambulatory Visit | Attending: Obstetrics and Gynecology | Admitting: Obstetrics and Gynecology

## 2017-03-10 ENCOUNTER — Encounter (HOSPITAL_COMMUNITY): Payer: Self-pay | Admitting: *Deleted

## 2017-03-10 ENCOUNTER — Emergency Department (HOSPITAL_COMMUNITY)
Admission: EM | Admit: 2017-03-10 | Discharge: 2017-03-10 | Disposition: A | Discharge status: Home / Self Care | Payer: Self-pay | Attending: Emergency Medicine | Admitting: Emergency Medicine

## 2017-03-10 DIAGNOSIS — T7840XA Allergy, unspecified, initial encounter: Secondary | ICD-10-CM

## 2017-03-10 DIAGNOSIS — T378X5A Adverse effect of other specified systemic anti-infectives and antiparasitics, initial encounter: Secondary | ICD-10-CM | POA: Insufficient documentation

## 2017-03-10 DIAGNOSIS — L509 Urticaria, unspecified: Secondary | ICD-10-CM | POA: Insufficient documentation

## 2017-03-10 DIAGNOSIS — T370X5A Adverse effect of sulfonamides, initial encounter: Secondary | ICD-10-CM | POA: Insufficient documentation

## 2017-03-10 DIAGNOSIS — R21 Rash and other nonspecific skin eruption: Secondary | ICD-10-CM | POA: Insufficient documentation

## 2017-03-10 DIAGNOSIS — Z5321 Procedure and treatment not carried out due to patient leaving prior to being seen by health care provider: Secondary | ICD-10-CM | POA: Insufficient documentation

## 2017-03-10 DIAGNOSIS — Z87891 Personal history of nicotine dependence: Secondary | ICD-10-CM | POA: Insufficient documentation

## 2017-03-10 HISTORY — DX: Trichomoniasis, unspecified: A59.9

## 2017-03-10 HISTORY — DX: Chlamydial infection, unspecified: A74.9

## 2017-03-10 HISTORY — DX: Dorsalgia, unspecified: M54.9

## 2017-03-10 LAB — URINALYSIS, ROUTINE W REFLEX MICROSCOPIC
BILIRUBIN URINE: NEGATIVE
Glucose, UA: NEGATIVE mg/dL
Hgb urine dipstick: NEGATIVE
KETONES UR: 5 mg/dL — AB
Nitrite: NEGATIVE
PH: 5 (ref 5.0–8.0)
Protein, ur: 30 mg/dL — AB
Specific Gravity, Urine: 1.027 (ref 1.005–1.030)

## 2017-03-10 LAB — POCT PREGNANCY, URINE: Preg Test, Ur: NEGATIVE

## 2017-03-10 NOTE — ED Notes (Signed)
No answer when called to reassess vitals x3.

## 2017-03-10 NOTE — Discharge Instructions (Signed)
Hives Hives (urticaria) are itchy, red, swollen areas on your skin. Hives can show up on any part of your body, and they can vary in size. They can be as small as the tip of a pen or much larger. Hives often fade within 24 hours (acute hives). In other cases, new hives show up after old ones fade. This can continue for many days or weeks (chronic hives). Hives are caused by your body's reaction to an irritant or to something that you are allergic to (trigger). You can get hives right after being around a trigger or hours later. Hives do not spread from person to person (are not contagious). Hives may get worse if you scratch them, if you exercise, or if you have worries (emotional stress). Follow these instructions at home: Medicines  Take or apply over-the-counter and prescription medicines only as told by your doctor.  If you were prescribed an antibiotic medicine, use it as told by your doctor. Do not stop taking the antibiotic even if you start to feel better. Skin Care  Apply cool, wet cloths (cool compresses) to the itchy, red, swollen areas.  Do not scratch your skin. Do not rub your skin. General instructions  Do not take hot showers or baths. This can make itching worse.  Do not wear tight clothes.  Use sunscreen and wear clothing that covers your skin when you are outside.  Avoid any triggers that cause your hives. Keep a journal to help you keep track of what causes your hives. Write down: ? What medicines you take. ? What you eat and drink. ? What products you use on your skin.  Keep all follow-up visits as told by your doctor. This is important. Contact a doctor if:  Your symptoms are not better with medicine.  Your joints are painful or swollen. Get help right away if:  You have a fever.  You have belly pain.  Your tongue or lips are swollen.  Your eyelids are swollen.  Your chest or throat feels tight.  You have trouble breathing or swallowing. These  symptoms may be an emergency. Do not wait to see if the symptoms will go away. Get medical help right away. Call your local emergency services (911 in the [place] Do not drive yourself to the hospital. This information is not intended to replace advice given to you by your health care provider. Make sure you discuss any questions you have with your health care provider. Document Released: 01/04/2008 Document Revised: 09/02/2015 Document Reviewed: 01/13/2015 Elsevier Interactive Patient Education  2018 [employer]  

## 2017-03-10 NOTE — ED Triage Notes (Signed)
Pt states since she started taking the Rx antibiotics, she noticed a generalized rash.

## 2017-03-10 NOTE — MAU Note (Signed)
I am having a reaction to medicine I was prescribed at [facility].  I waited 7 hours there yesterday and this morning and had to leave.  Took an Benadryl and it didn't help.  Having hives after taking pills on Tuesday and vomiting yesterday.   Medicines are Metronidazole and Bactrim DS. Not pregnant. No bleeding. Concerned about thigh and calf muscles tensing up & relaxing-  over the past week also.

## 2017-03-10 NOTE — ED Notes (Signed)
No answer when called to reassess vitals x2.

## 2017-03-10 NOTE — ED Notes (Signed)
No answer when called to reassess vitals x1 
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
## 2017-07-11 ENCOUNTER — Emergency Department (HOSPITAL_COMMUNITY)
Admission: EM | Admit: 2017-07-11 | Discharge: 2017-07-11 | Disposition: A | Discharge status: Home / Self Care | Payer: BLUE CROSS/BLUE SHIELD | Attending: Emergency Medicine | Admitting: Emergency Medicine

## 2017-07-11 ENCOUNTER — Encounter (HOSPITAL_COMMUNITY): Payer: Self-pay | Admitting: Emergency Medicine

## 2017-07-11 DIAGNOSIS — Z87891 Personal history of nicotine dependence: Secondary | ICD-10-CM | POA: Diagnosis not present

## 2017-07-11 DIAGNOSIS — N898 Other specified noninflammatory disorders of vagina: Secondary | ICD-10-CM | POA: Diagnosis present

## 2017-07-11 DIAGNOSIS — N760 Acute vaginitis: Secondary | ICD-10-CM | POA: Diagnosis not present

## 2017-07-11 DIAGNOSIS — Z79899 Other long term (current) drug therapy: Secondary | ICD-10-CM | POA: Diagnosis not present

## 2017-07-11 DIAGNOSIS — B9689 Other specified bacterial agents as the cause of diseases classified elsewhere: Secondary | ICD-10-CM | POA: Insufficient documentation

## 2017-07-11 LAB — GC/CHLAMYDIA PROBE AMP (~~LOC~~) NOT AT ARMC
Chlamydia: POSITIVE — AB
Neisseria Gonorrhea: NEGATIVE

## 2017-07-11 LAB — WET PREP, GENITAL
Sperm: NONE SEEN
TRICH WET PREP: NONE SEEN
WBC WET PREP: NONE SEEN
Yeast Wet Prep HPF POC: NONE SEEN

## 2017-07-11 LAB — URINALYSIS, ROUTINE W REFLEX MICROSCOPIC
Bilirubin Urine: NEGATIVE
GLUCOSE, UA: NEGATIVE mg/dL
Hgb urine dipstick: NEGATIVE
Ketones, ur: 5 mg/dL — AB
LEUKOCYTES UA: NEGATIVE
NITRITE: NEGATIVE
PH: 6 (ref 5.0–8.0)
PROTEIN: NEGATIVE mg/dL
Specific Gravity, Urine: 1.025 (ref 1.005–1.030)

## 2017-07-11 MED ORDER — CEFTRIAXONE SODIUM 250 MG IJ SOLR
250.0000 mg | Freq: Once | INTRAMUSCULAR | Status: AC
  Administered 2017-07-11: 250 mg via INTRAMUSCULAR
  Filled 2017-07-11: qty 250

## 2017-07-11 MED ORDER — AZITHROMYCIN 250 MG PO TABS
1000.0000 mg | ORAL_TABLET | Freq: Once | ORAL | Status: AC
  Administered 2017-07-11: 1000 mg via ORAL
  Filled 2017-07-11: qty 4

## 2017-07-11 MED ORDER — METRONIDAZOLE 500 MG PO TABS: 500.0000 mg | ORAL_TABLET | Freq: Two times a day (BID) | ORAL | 0 refills | Status: DC
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
## 2017-07-11 NOTE — ED Triage Notes (Signed)
Patient here from home with complaints of vaginal irritation. States that she needs to be checked for all STIs.  Denies discharge.

## 2017-07-11 NOTE — Discharge Instructions (Signed)
Please take all of your antibiotics until finished!  Use a condom with every sexual encounter Follow up with an OBGYN. You can call the clinic listed or make an appointment with an OBGYN of your choosing.  Please return to the ER for fevers, vomiting, new or worsening symptoms, any additional concerns.   You have been tested for chlamydia and gonorrhea. These results will be available in approximately 3 days. You will be notified if they are positive.

## 2017-07-13 ENCOUNTER — Encounter (HOSPITAL_COMMUNITY): Payer: Self-pay | Admitting: Emergency Medicine

## 2017-07-13 ENCOUNTER — Other Ambulatory Visit: Payer: Self-pay

## 2017-07-13 ENCOUNTER — Emergency Department (HOSPITAL_COMMUNITY)
Admission: EM | Admit: 2017-07-13 | Discharge: 2017-07-13 | Disposition: A | Discharge status: Home / Self Care | Payer: BLUE CROSS/BLUE SHIELD | Attending: Emergency Medicine | Admitting: Emergency Medicine

## 2017-07-13 DIAGNOSIS — A6004 Herpesviral vulvovaginitis: Secondary | ICD-10-CM | POA: Diagnosis not present

## 2017-07-13 DIAGNOSIS — N739 Female pelvic inflammatory disease, unspecified: Secondary | ICD-10-CM | POA: Diagnosis not present

## 2017-07-13 DIAGNOSIS — N730 Acute parametritis and pelvic cellulitis: Secondary | ICD-10-CM

## 2017-07-13 DIAGNOSIS — Z87891 Personal history of nicotine dependence: Secondary | ICD-10-CM | POA: Insufficient documentation

## 2017-07-13 DIAGNOSIS — R21 Rash and other nonspecific skin eruption: Secondary | ICD-10-CM | POA: Diagnosis present

## 2017-07-13 MED ORDER — HYDROCODONE-ACETAMINOPHEN 5-325 MG PO TABS
1.0000 | ORAL_TABLET | Freq: Four times a day (QID) | ORAL | 0 refills | Status: DC | PRN
Start: 2017-07-13 — End: 2017-10-02

## 2017-07-13 MED ORDER — VALACYCLOVIR HCL 1 G PO TABS: 1000.0000 mg | ORAL_TABLET | Freq: Two times a day (BID) | ORAL | 0 refills | Status: AC

## 2017-07-13 MED ORDER — HYDROCODONE-ACETAMINOPHEN 5-325 MG PO TABS
2.0000 | ORAL_TABLET | Freq: Once | ORAL | Status: AC
  Administered 2017-07-13: 2 via ORAL
  Filled 2017-07-13: qty 2

## 2017-07-13 MED ORDER — ONDANSETRON 4 MG PO TBDP: 4.0000 mg | ORAL_TABLET | Freq: Three times a day (TID) | ORAL | 0 refills | Status: DC | PRN

## 2017-07-13 MED ORDER — DOXYCYCLINE HYCLATE 100 MG PO CAPS
100.0000 mg | ORAL_CAPSULE | Freq: Two times a day (BID) | ORAL | 0 refills | Status: AC
Start: 2017-07-13 — End: 2017-07-27

## 2017-07-13 MED ORDER — DOXYCYCLINE HYCLATE 100 MG PO TABS
100.0000 mg | ORAL_TABLET | Freq: Once | ORAL | Status: AC
  Administered 2017-07-13: 100 mg via ORAL
  Filled 2017-07-13: qty 1
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
## 2017-07-13 NOTE — ED Triage Notes (Signed)
Patient reports she was seen x2 days ago and dx with BV. Reports she has not taken Flagyl because she "had a reaction last time" she took it. States new painful rash to vagina x2 days.

## 2017-09-25 ENCOUNTER — Emergency Department (HOSPITAL_COMMUNITY)
Admission: EM | Admit: 2017-09-25 | Discharge: 2017-09-25 | Disposition: A | Discharge status: Home / Self Care | Payer: BLUE CROSS/BLUE SHIELD | Attending: Emergency Medicine | Admitting: Emergency Medicine

## 2017-09-25 ENCOUNTER — Encounter (HOSPITAL_COMMUNITY): Payer: Self-pay

## 2017-09-25 ENCOUNTER — Other Ambulatory Visit: Payer: Self-pay

## 2017-09-25 ENCOUNTER — Emergency Department (HOSPITAL_COMMUNITY): Payer: BLUE CROSS/BLUE SHIELD

## 2017-09-25 DIAGNOSIS — R2242 Localized swelling, mass and lump, left lower limb: Secondary | ICD-10-CM | POA: Diagnosis present

## 2017-09-25 DIAGNOSIS — W228XXA Striking against or struck by other objects, initial encounter: Secondary | ICD-10-CM | POA: Insufficient documentation

## 2017-09-25 DIAGNOSIS — M898X7 Other specified disorders of bone, ankle and foot: Secondary | ICD-10-CM | POA: Insufficient documentation

## 2017-09-25 DIAGNOSIS — Z79899 Other long term (current) drug therapy: Secondary | ICD-10-CM | POA: Diagnosis not present
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
## 2017-09-25 NOTE — ED Notes (Signed)
Pt is alert and oriented x 4 and is verbally responsive. Pt reports that she has left big toe pain that started approx. 3 months. Pt reports tenderness with touch and area appears to have a red bump above nail. Pt reports that she has tried warm soaks and has not been effective. Pt repots that she stubbed her toe 2 weeks ago and now the area has worsened and increased in pain. Pt denies any drainage to the area.

## 2017-09-25 NOTE — Discharge Instructions (Signed)
Your x-ray shows evidence of exostosis of your toe which is where the bone grows outside of where it typically does.  You will need to follow-up with a foot specialist regarding this I have provided 2 options below.  There is no evidence of infection or fracture.  Return for worsening pain, swelling, redness or discoloration of the toe.

## 2017-09-25 NOTE — ED Triage Notes (Signed)
Patient c/o left great toe swelling and pain x 2 weeks. Patient states she stubbed her toe.

## 2017-10-02 ENCOUNTER — Ambulatory Visit (INDEPENDENT_AMBULATORY_CARE_PROVIDER_SITE_OTHER): Payer: BLUE CROSS/BLUE SHIELD | Admitting: Podiatry

## 2017-10-02 VITALS — BP 95/61 | HR 76 | Resp 16

## 2017-10-02 DIAGNOSIS — M898X7 Other specified disorders of bone, ankle and foot: Secondary | ICD-10-CM

## 2017-10-02 NOTE — Patient Instructions (Signed)
Pre-Operative Instructions  Congratulations, you have decided to take an important step towards improving your quality of life.  You can be assured that the doctors and staff at [facility] will be with you every step of the way.  Here are some important things you should know:  1. Plan to be at the surgery center/hospital at least 1 (one) hour prior to your scheduled time, unless otherwise directed by the surgical center/hospital staff.  You must have a responsible adult accompany you, remain during the surgery and drive you home.  Make sure you have directions to the surgical center/hospital to ensure you arrive on time. 2. If you are having surgery at [facility] or [facility], you will need a copy of your medical history and physical form from your family physician within one month prior to the date of surgery. We will give you a form for your primary physician to complete.  3. We make every effort to accommodate the date you request for surgery.  However, there are times where surgery dates or times have to be moved.  We will contact you as soon as possible if a change in schedule is required.   4. No aspirin/ibuprofen for one week before surgery.  If you are on aspirin, any non-steroidal anti-inflammatory medications (Mobic, Aleve, Ibuprofen) should not be taken seven (7) days prior to your surgery.  You make take Tylenol for pain prior to surgery.  5. Medications - If you are taking daily heart and blood pressure medications, seizure, reflux, allergy, asthma, anxiety, pain or diabetes medications, make sure you notify the surgery center/hospital before the day of surgery so they can tell you which medications you should take or avoid the day of surgery. 6. No food or drink after midnight the night before surgery unless directed otherwise by surgical center/hospital staff. 7. No alcoholic beverages 24-hours prior to surgery.  No smoking 24-hours prior or 24-hours after  surgery. 8. Wear loose pants or shorts. They should be loose enough to fit over bandages, boots, and casts. 9. Don't wear slip-on shoes. Sneakers are preferred. 10. Bring your boot with you to the surgery center/hospital.  Also bring crutches or a walker if your physician has prescribed it for you.  If you do not have this equipment, it will be provided for you after surgery. 11. If you have not been contacted by the surgery center/hospital by the day before your surgery, call to confirm the date and time of your surgery. 12. Leave-time from work may vary depending on the type of surgery you have.  Appropriate arrangements should be made prior to surgery with your employer. 13. Prescriptions will be provided immediately following surgery by your doctor.  Fill these as soon as possible after surgery and take the medication as directed. Pain medications will not be refilled on weekends and must be approved by the doctor. 14. Remove nail polish on the operative foot and avoid getting pedicures prior to surgery. 15. Wash the night before surgery.  The night before surgery wash the foot and leg well with water and the antibacterial soap provided. Be sure to pay special attention to beneath the toenails and in between the toes.  Wash for at least three (3) minutes. Rinse thoroughly with water and dry well with a towel.  Perform this wash unless told not to do so by your physician.  Enclosed: 1 Ice pack (please put in freezer the night before surgery)   1 Hibiclens skin cleaner     Pre-op instructions  If you have any questions regarding the instructions, please do not hesitate to call our office.  [place]: [address], [place], [place] [address] -- [phone]  [place]: [address] [place], [place] [address] -- [phone]  [place]: [address]  [place], [place] [address] -- [phone]  [place]: [address], [address], [place], [place] [address] -- [phone]  Website:  [email or web address] 
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
## 2017-10-05 ENCOUNTER — Telehealth: Payer: Self-pay | Admitting: *Deleted

## 2017-10-05 NOTE — Telephone Encounter (Signed)
I attempted to call the patient to see if she would like to reschedule her surgery from 11/16/2017 to 11/02/2017.  Dr. Milinda Pointer had a cancellation on 11/02/2017.  She requested to be put on a wait list.  Her phone message said she was not available at this time, please try back later.

## 2017-10-09 NOTE — Telephone Encounter (Signed)
I attempted to call the patient again.  I received a message that she was not available.  I could not leave a message.

## 2017-11-14 ENCOUNTER — Other Ambulatory Visit: Payer: Self-pay | Admitting: Podiatry

## 2017-11-14 MED ORDER — ONDANSETRON HCL 4 MG PO TABS: 4.0000 mg | ORAL_TABLET | Freq: Three times a day (TID) | ORAL | 0 refills | Status: DC | PRN

## 2017-11-14 MED ORDER — CEPHALEXIN 500 MG PO CAPS: 500.0000 mg | ORAL_CAPSULE | Freq: Three times a day (TID) | ORAL | 0 refills | Status: DC

## 2017-11-14 MED ORDER — OXYCODONE-ACETAMINOPHEN 10-325 MG PO TABS: 1.0000 | ORAL_TABLET | Freq: Three times a day (TID) | ORAL | 0 refills | Status: AC | PRN

## 2017-11-20 ENCOUNTER — Telehealth: Payer: Self-pay | Admitting: *Deleted

## 2017-11-20 NOTE — Telephone Encounter (Signed)
"  Catherine Weber, who is scheduled with Dr. Milinda Pointer for Friday called to reschedule her procedure.  So I don't know.  Can you give her a call to schedule another date?  Her telephone number is [phone].  Also make sure she's not pregnant."

## 2017-11-20 NOTE — Telephone Encounter (Signed)
"  I was calling to see when I can reschedule my surgical procedure.  Please give me a call."

## 2017-11-22 ENCOUNTER — Encounter: Payer: BLUE CROSS/BLUE SHIELD | Admitting: Podiatry

## 2017-11-29 NOTE — Telephone Encounter (Signed)
"  I need to reschedule my surgery."  What was the reason for the cancellation on August 9?  "The anesthesiologist canceled it."  Was it due to questionable pregnancy?  "Yes, it was."  Are you pregnant?  "I am not sure yet.  My next cycle is due in a week."  Dr. Stephenie Acres next available date is October 11.  "Okay, that is fine."  I'll get it scheduled.  Let us know if you are pregnant.  "Okay, I will."

## 2018-01-16 ENCOUNTER — Other Ambulatory Visit: Payer: Self-pay | Admitting: Podiatry

## 2018-01-16 MED ORDER — OXYCODONE-ACETAMINOPHEN 10-325 MG PO TABS: 1.0000 | ORAL_TABLET | Freq: Three times a day (TID) | ORAL | 0 refills | Status: AC | PRN

## 2018-01-16 MED ORDER — CEPHALEXIN 500 MG PO CAPS: 500.0000 mg | ORAL_CAPSULE | Freq: Three times a day (TID) | ORAL | 0 refills | Status: DC

## 2018-01-16 MED ORDER — ONDANSETRON HCL 4 MG PO TABS: 4.0000 mg | ORAL_TABLET | Freq: Three times a day (TID) | ORAL | 0 refills | Status: DC | PRN

## 2018-01-17 ENCOUNTER — Telehealth: Payer: Self-pay | Admitting: *Deleted

## 2018-01-17 NOTE — Telephone Encounter (Signed)
Called pt to see if she had spoken to anyone at [facility] about her insurance yet as it showing on our end that her insurance ended on 30 June. Pt stated she had not spoken to anyone and that when she was looking online it was showing active and she didn't understand why its showing it ended when it wasn't supposed to start until August. I told her the surgical center was giving her until 4:30 and then they were going to cancel her surgery. Pt stated she would go ahead and cancel and call back to reschedule her surgery once she gets everything settled. I called and spoke to [place] at [facility] and she was going to cancel the surgery in one medical passport.

## 2018-01-24 ENCOUNTER — Other Ambulatory Visit: Payer: BLUE CROSS/BLUE SHIELD

## 2018-12-25 ENCOUNTER — Emergency Department (HOSPITAL_COMMUNITY)
Admission: EM | Admit: 2018-12-25 | Discharge: 2018-12-25 | Disposition: A | Discharge status: Home / Self Care | Payer: Medicaid Other | Attending: Emergency Medicine | Admitting: Emergency Medicine

## 2018-12-25 ENCOUNTER — Inpatient Hospital Stay (HOSPITAL_COMMUNITY)
Admission: AD | Admit: 2018-12-25 | Discharge: 2018-12-25 | Disposition: A | Discharge status: Home / Self Care | Payer: Self-pay | Attending: Obstetrics and Gynecology | Admitting: Obstetrics and Gynecology

## 2018-12-25 ENCOUNTER — Other Ambulatory Visit: Payer: Self-pay

## 2018-12-25 ENCOUNTER — Encounter (HOSPITAL_COMMUNITY): Payer: Self-pay | Admitting: Emergency Medicine

## 2018-12-25 DIAGNOSIS — Z5321 Procedure and treatment not carried out due to patient leaving prior to being seen by health care provider: Secondary | ICD-10-CM | POA: Insufficient documentation

## 2018-12-25 DIAGNOSIS — Z3202 Encounter for pregnancy test, result negative: Secondary | ICD-10-CM

## 2018-12-25 DIAGNOSIS — R109 Unspecified abdominal pain: Secondary | ICD-10-CM | POA: Insufficient documentation

## 2018-12-25 LAB — I-STAT BETA HCG BLOOD, ED (MC, WL, AP ONLY): I-stat hCG, quantitative: <5 m[IU]/mL (ref <5)

## 2018-12-25 LAB — POCT PREGNANCY, URINE: Preg Test, Ur: NEGATIVE

## 2018-12-25 MED ORDER — SODIUM CHLORIDE 0.9% FLUSH: 3.0000 mL | Freq: Once | INTRAVENOUS | Status: DC

## 2018-12-25 NOTE — MAU Provider Note (Signed)
First Provider Initiated Contact with Patient 12/25/18 1440      S [name] is a 28 y.o. G1P0010 non-pregnant female who presents to MAU today with complaint of cramping, spotting, right sided pressure, bloating since her last period started on 12/11/2018.   O There were no vitals taken for this visit. Physical Exam  Constitutional: She is oriented to person, place, and time. She appears well-developed and well-nourished. She appears distressed.  HENT:  Head: Normocephalic and atraumatic.  Respiratory: Effort normal.  Neurological: She is alert and oriented to person, place, and time.  Skin: She is not diaphoretic.  Psychiatric: She has a normal mood and affect. Her behavior is normal. Judgment and thought content normal.  Pt is sitting in a wheelchair, holding her right bottom ribs in triage, shakking her left leg rapidly and repeatedly.  A Non pregnant female Medical screening exam complete  P Discharge from MAU in stable condition Patient transferred to the ED for evaluation, called and spoke with Dr. Melina Copa in ED to alert to transfer Patient may return to MAU as needed for pregnancy related complaints  Nugent, Gerrie Nordmann, NP 12/25/2018 2:53 PM

## 2018-12-25 NOTE — ED Triage Notes (Signed)
Pt [facility] where she presented for lower abd pain and vaginal spotting. Pt states she had her normal period on 9/02 and then since then has had lower abd pain worse on right lower side into back- pt also reports spotting. Pt states she had  A neagtive preg test at MAU and was escorted to ER.

## 2018-12-25 NOTE — MAU Note (Signed)
ED charge nurse given report on pt

## 2021-01-05 ENCOUNTER — Encounter (HOSPITAL_COMMUNITY): Payer: Self-pay | Admitting: *Deleted

## 2021-01-05 ENCOUNTER — Emergency Department (HOSPITAL_COMMUNITY): Payer: BC Managed Care – PPO

## 2021-01-05 ENCOUNTER — Emergency Department (HOSPITAL_COMMUNITY)
Admission: EM | Admit: 2021-01-05 | Discharge: 2021-01-05 | Disposition: A | Discharge status: Home / Self Care | Payer: BC Managed Care – PPO | Attending: Emergency Medicine | Admitting: Emergency Medicine

## 2021-01-05 DIAGNOSIS — S60221A Contusion of right hand, initial encounter: Secondary | ICD-10-CM | POA: Diagnosis not present

## 2021-01-05 DIAGNOSIS — Z87891 Personal history of nicotine dependence: Secondary | ICD-10-CM | POA: Insufficient documentation

## 2021-01-05 DIAGNOSIS — W228XXA Striking against or struck by other objects, initial encounter: Secondary | ICD-10-CM | POA: Diagnosis not present

## 2021-01-05 DIAGNOSIS — S6991XA Unspecified injury of right wrist, hand and finger(s), initial encounter: Secondary | ICD-10-CM | POA: Diagnosis present
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
## 2021-01-05 NOTE — Discharge Instructions (Addendum)
X-ray is normal.  You can use ibuprofen or tylenol as needed for the pain.
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
## 2021-06-16 ENCOUNTER — Ambulatory Visit (INDEPENDENT_AMBULATORY_CARE_PROVIDER_SITE_OTHER): Payer: 59

## 2021-06-16 ENCOUNTER — Other Ambulatory Visit: Payer: Self-pay

## 2021-06-16 ENCOUNTER — Ambulatory Visit (INDEPENDENT_AMBULATORY_CARE_PROVIDER_SITE_OTHER): Payer: 59 | Admitting: Podiatry

## 2021-06-16 DIAGNOSIS — D1632 Benign neoplasm of short bones of left lower limb: Secondary | ICD-10-CM

## 2021-06-17 ENCOUNTER — Encounter: Payer: Self-pay | Admitting: Podiatry
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
## 2021-06-24 ENCOUNTER — Telehealth: Payer: Self-pay | Admitting: *Deleted

## 2021-06-24 NOTE — Telephone Encounter (Signed)
Resubmitted the order for MRI to [employer], waiting for prior authorization,confirmation received 06/24/21. ?

## 2021-06-27 NOTE — Telephone Encounter (Signed)
Patient has been approved for MRI by the [employer]: Authorization #: [id number] from 06/24/21-09/24/21. ?Imaging [facility] -Jule Ser has been notified and information has been faxed to their office to contact patient to schedule. ?

## 2021-06-30 ENCOUNTER — Ambulatory Visit (HOSPITAL_BASED_OUTPATIENT_CLINIC_OR_DEPARTMENT_OTHER): Payer: 59

## 2021-07-04 ENCOUNTER — Ambulatory Visit (INDEPENDENT_AMBULATORY_CARE_PROVIDER_SITE_OTHER): Payer: 59

## 2021-07-04 ENCOUNTER — Other Ambulatory Visit: Payer: Self-pay

## 2021-07-04 DIAGNOSIS — D1632 Benign neoplasm of short bones of left lower limb: Secondary | ICD-10-CM | POA: Diagnosis not present

## 2021-07-04 MED ORDER — GADOBUTROL 1 MMOL/ML IV SOLN
6.0000 mL | Freq: Once | INTRAVENOUS | Status: AC | PRN
  Administered 2021-07-04: 6 mL via INTRAVENOUS

## 2021-07-05 NOTE — Progress Notes (Signed)
Can you schedule her for f/u for MRI and possible surgery consult? Thanks!

## 2021-07-14 ENCOUNTER — Ambulatory Visit (INDEPENDENT_AMBULATORY_CARE_PROVIDER_SITE_OTHER): Payer: 59 | Admitting: Podiatry

## 2021-07-14 DIAGNOSIS — D1632 Benign neoplasm of short bones of left lower limb: Secondary | ICD-10-CM

## 2021-07-17 ENCOUNTER — Encounter: Payer: Self-pay | Admitting: Podiatry
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
## 2021-07-29 ENCOUNTER — Telehealth: Payer: Self-pay

## 2021-07-29 NOTE — Telephone Encounter (Signed)
DOS 08/26/2021 ? ?EXOSTECTOMY TA - [id number] ? ?[employer] ? ?[place] [facility] [employer]. AUTH # [id number] GOOD FROM 07/28/2021 - 10/27/2021 ?

## 2021-08-23 ENCOUNTER — Telehealth: Payer: Self-pay

## 2021-08-23 NOTE — Telephone Encounter (Signed)
Caren Griffins with [facility] called, Norene called them to cancel her surgery with Dr. Sherryle Lis on 08/26/2021. She stated she has transportation issues. Caren Griffins offered her the latest appointment but the patient stated she would need an evening surgery time. Notified Dr. Sherryle Lis ?

## 2021-09-01 ENCOUNTER — Encounter: Payer: 59 | Admitting: Podiatry

## 2021-09-15 ENCOUNTER — Encounter: Payer: 59 | Admitting: Podiatry

## 2021-09-22 ENCOUNTER — Ambulatory Visit: Payer: 59 | Admitting: Podiatry

## 2021-10-06 ENCOUNTER — Encounter: Payer: 59 | Admitting: Podiatry

## 2021-11-09 DIAGNOSIS — M25512 Pain in left shoulder: Secondary | ICD-10-CM | POA: Diagnosis not present

## 2021-11-09 DIAGNOSIS — G8929 Other chronic pain: Secondary | ICD-10-CM | POA: Diagnosis not present

## 2021-11-09 DIAGNOSIS — M25511 Pain in right shoulder: Secondary | ICD-10-CM | POA: Diagnosis not present

## 2021-12-19 DIAGNOSIS — G8929 Other chronic pain: Secondary | ICD-10-CM | POA: Diagnosis not present

## 2021-12-19 DIAGNOSIS — M25512 Pain in left shoulder: Secondary | ICD-10-CM | POA: Diagnosis not present

## 2021-12-28 DIAGNOSIS — M7542 Impingement syndrome of left shoulder: Secondary | ICD-10-CM | POA: Diagnosis not present

## 2021-12-28 DIAGNOSIS — M25512 Pain in left shoulder: Secondary | ICD-10-CM | POA: Diagnosis not present

## 2022-01-11 DIAGNOSIS — M7542 Impingement syndrome of left shoulder: Secondary | ICD-10-CM | POA: Diagnosis not present

## 2022-01-11 DIAGNOSIS — M25512 Pain in left shoulder: Secondary | ICD-10-CM | POA: Diagnosis not present

## 2022-02-08 DIAGNOSIS — M7542 Impingement syndrome of left shoulder: Secondary | ICD-10-CM | POA: Diagnosis not present

## 2022-04-12 ENCOUNTER — Ambulatory Visit (INDEPENDENT_AMBULATORY_CARE_PROVIDER_SITE_OTHER): Payer: 59 | Admitting: Podiatry

## 2022-04-12 DIAGNOSIS — D1632 Benign neoplasm of short bones of left lower limb: Secondary | ICD-10-CM | POA: Diagnosis not present
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
## 2022-04-21 ENCOUNTER — Telehealth: Payer: Self-pay | Admitting: Podiatry

## 2022-04-21 NOTE — Telephone Encounter (Signed)
DOS: 05/22/2022  [employer] Effective 04/10/2022  Exostectomy Hallux Lt ([id number]) Nail Removal Hallux Lt ([id number])  Deductible: $6,200 with $6,037.09 remaining Out-of-Pocket: $7,500 with $7,337.09 remaining CoInsurance: 50%  Prior authorization is not required per [employer], [employer]film/video.  Call Reference #: [id number]

## 2022-04-23 DIAGNOSIS — J029 Acute pharyngitis, unspecified: Secondary | ICD-10-CM | POA: Diagnosis not present

## 2022-05-02 DIAGNOSIS — B370 Candidal stomatitis: Secondary | ICD-10-CM | POA: Diagnosis not present

## 2022-05-23 ENCOUNTER — Telehealth: Payer: Self-pay

## 2022-05-23 NOTE — Telephone Encounter (Signed)
Surgery for Catherine Weber was cancelled for 05/22/2022. I spoke to [place] at [facility] and she stated it was financial reasons.

## 2022-05-30 ENCOUNTER — Encounter: Payer: 59 | Admitting: Podiatry

## 2022-06-12 ENCOUNTER — Other Ambulatory Visit: Payer: Self-pay

## 2022-06-12 ENCOUNTER — Emergency Department (HOSPITAL_BASED_OUTPATIENT_CLINIC_OR_DEPARTMENT_OTHER)
Admission: EM | Admit: 2022-06-12 | Discharge: 2022-06-12 | Discharge status: Against Medical Advice | Payer: 59 | Attending: Emergency Medicine | Admitting: Emergency Medicine

## 2022-06-12 DIAGNOSIS — Z20822 Contact with and (suspected) exposure to covid-19: Secondary | ICD-10-CM | POA: Insufficient documentation

## 2022-06-12 DIAGNOSIS — Z6824 Body mass index (BMI) 24.0-24.9, adult: Secondary | ICD-10-CM | POA: Diagnosis not present

## 2022-06-12 DIAGNOSIS — Z5321 Procedure and treatment not carried out due to patient leaving prior to being seen by health care provider: Secondary | ICD-10-CM | POA: Insufficient documentation

## 2022-06-12 DIAGNOSIS — Z789 Other specified health status: Secondary | ICD-10-CM | POA: Diagnosis not present

## 2022-06-12 DIAGNOSIS — Z113 Encounter for screening for infections with a predominantly sexual mode of transmission: Secondary | ICD-10-CM | POA: Diagnosis not present

## 2022-06-12 DIAGNOSIS — Z202 Contact with and (suspected) exposure to infections with a predominantly sexual mode of transmission: Secondary | ICD-10-CM | POA: Diagnosis not present

## 2022-06-12 DIAGNOSIS — M7918 Myalgia, other site: Secondary | ICD-10-CM | POA: Diagnosis not present

## 2022-06-12 DIAGNOSIS — J101 Influenza due to other identified influenza virus with other respiratory manifestations: Secondary | ICD-10-CM | POA: Diagnosis not present

## 2022-06-12 DIAGNOSIS — N898 Other specified noninflammatory disorders of vagina: Secondary | ICD-10-CM | POA: Diagnosis not present

## 2022-06-12 LAB — RESP PANEL BY RT-PCR (RSV, FLU A&B, COVID)  RVPGX2
Influenza A by PCR: POSITIVE — AB
Influenza B by PCR: NEGATIVE
Resp Syncytial Virus by PCR: NEGATIVE
SARS Coronavirus 2 by RT PCR: NEGATIVE

## 2022-06-12 LAB — GROUP A STREP BY PCR: Group A Strep by PCR: NOT DETECTED

## 2022-06-12 NOTE — ED Notes (Signed)
Per registration pt leaving

## 2022-06-12 NOTE — ED Triage Notes (Signed)
Patient presents to ED via POV from home. Here with sore throat, generalized body aches and cough.

## 2022-06-13 ENCOUNTER — Other Ambulatory Visit: Payer: Self-pay

## 2022-06-13 ENCOUNTER — Ambulatory Visit: Payer: Self-pay | Admitting: *Deleted

## 2022-06-13 ENCOUNTER — Emergency Department (HOSPITAL_BASED_OUTPATIENT_CLINIC_OR_DEPARTMENT_OTHER)
Admission: EM | Admit: 2022-06-13 | Discharge: 2022-06-13 | Disposition: A | Discharge status: Home / Self Care | Payer: 59 | Attending: Emergency Medicine | Admitting: Emergency Medicine

## 2022-06-13 ENCOUNTER — Other Ambulatory Visit: Payer: 59

## 2022-06-13 ENCOUNTER — Encounter (HOSPITAL_BASED_OUTPATIENT_CLINIC_OR_DEPARTMENT_OTHER): Payer: Self-pay | Admitting: Emergency Medicine

## 2022-06-13 DIAGNOSIS — J101 Influenza due to other identified influenza virus with other respiratory manifestations: Secondary | ICD-10-CM | POA: Diagnosis not present

## 2022-06-13 DIAGNOSIS — J029 Acute pharyngitis, unspecified: Secondary | ICD-10-CM | POA: Diagnosis not present

## 2022-06-13 DIAGNOSIS — Z708 Other sex counseling: Secondary | ICD-10-CM | POA: Insufficient documentation

## 2022-06-13 DIAGNOSIS — Z91040 Latex allergy status: Secondary | ICD-10-CM | POA: Insufficient documentation

## 2022-06-13 LAB — HIV ANTIBODY (ROUTINE TESTING W REFLEX): HIV Screen 4th Generation wRfx: NONREACTIVE

## 2022-06-13 NOTE — Telephone Encounter (Signed)
Reason for Disposition  [1] Probable influenza (fever) with no complications AND [id number] NOT HIGH RISK  Answer Assessment - Initial Assessment Questions 1. WORST SYMPTOM: "What is your worst symptom?" (e.g., cough, runny nose, muscle aches, headache, sore throat, fever)      Fever, body aches, congestion 2. ONSET: "When did your flu symptoms start?"      3-4 days 3. COUGH: "How bad is the cough?"       Yes- last couple days- getting better 4. RESPIRATORY DISTRESS: "Describe your breathing."      Congested- nasal passages blocked 5. FEVER: "Do you have a fever?" If Yes, ask: "What is your temperature, how was it measured, and when did it start?"     Yes-  6. 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 FLU VACCINE: "Did you get a flu shot this year?"     no 8. HIGH RISK DISEASE: "Do you have any chronic medical problems?" (e.g., heart or lung disease, asthma, weak immune system, or other HIGH RISK conditions)     no 9. PREGNANCY: "Is there any chance you are pregnant?" "When was your last menstrual period?"     Last cycle- end of February - not using birth control 10. OTHER SYMPTOMS: "Do you have any other symptoms?"  (e.g., runny nose, muscle aches, headache, sore throat)       Muscle aches  Protocols used: Influenza (Flu) - [facility]

## 2022-06-13 NOTE — Discharge Instructions (Signed)
You are positive for influenza, recommend symptomatic management with Tylenol ibuprofen and continue to push oral hydration at home.  Your STI testing results will be available on the patient portal.

## 2022-06-13 NOTE — Telephone Encounter (Signed)
  Chief Complaint: ED- Left before diagnosis- lab + flu Symptoms: fever, nasal congestion, body aches Frequency: 3-4 day Pertinent Negatives: Patient denies SOB Disposition: '[]'$ ED /'[]'$ Urgent Care (no appt availability in office) / '[]'$ Appointment(In office/virtual)/ '[]'$  [facility]/ '[x]'$ Home Care/ '[]'$ Refused Recommended Disposition /'[]'$ [place] [facility]/ '[]'$  Follow-up with PCP Additional Notes: Patient is concerned about her lab results- advised + flu. Patient states she is going back to ED - she needs a note for work

## 2022-06-13 NOTE — ED Notes (Signed)
Discharge paperwork reviewed entirely with patient, including Rx's and follow up care. Pain was under control. Pt verbalized understanding as well as all parties involved. No questions or concerns voiced at the time of discharge. No acute distress noted.   Pt ambulated out to PVA without incident or assistance.

## 2022-06-13 NOTE — ED Provider Notes (Signed)
[place] [facility] Provider Note   CSN: [id number] Arrival date & time: 06/13/22  1027     History  Chief Complaint  Patient presents with   Sore Throat    Catherine Weber is a 32 y.o. female.  HPI   32 year old female presenting to the emerged department with a sore throat for the past 4 days.  She states that she had a new sexual partner recently and was worried that she had HSV.  She presented to the emergency department yesterday and was positive by PCR testing for influenza.  She has had a flulike illness with a sore throat and bodyaches for the past 4 days in total.  She is tolerating oral intake.  She denies any cough, fever or chills.  She is worried about STIs and consents to STI testing.  He has no dysuria, vaginal discharge, pelvic pain and declines gonorrhea chlamydia testing. She is unsure if her new sexual partner was positive for HSV.  Home Medications Prior to Admission medications   Medication Sig Start Date End Date Taking? Authorizing Provider  cephALEXin (KEFLEX) 500 MG capsule Take 1 capsule (500 mg total) by mouth 3 (three) times daily. 11/14/17   Hyatt, Max T, DPM  cephALEXin (KEFLEX) 500 MG capsule Take 1 capsule (500 mg total) by mouth 3 (three) times daily. 01/16/18   Hyatt, Max T, DPM  ondansetron (ZOFRAN) 4 MG tablet Take 1 tablet (4 mg total) by mouth every 8 (eight) hours as needed for nausea or vomiting. 11/14/17   Hyatt, Max T, DPM  ondansetron (ZOFRAN) 4 MG tablet Take 1 tablet (4 mg total) by mouth every 8 (eight) hours as needed for nausea or vomiting. 01/16/18   Hyatt, Max T, DPM      Allergies    Flagyl [metronidazole] and Latex    Review of Systems   Review of Systems  All other systems reviewed and are negative.   Physical Exam Updated Vital Signs BP 118/79 (BP Location: Left Arm)   Pulse 94   Temp 98.8 F (37.1 C) (Oral)   Resp 16   Wt 66.7 kg   LMP 06/04/2022   SpO2 99%   BMI 24.46 kg/m  Physical  Exam Vitals and nursing note reviewed.  Constitutional:      General: She is not in acute distress.    Appearance: She is well-developed.  HENT:     Head: Normocephalic and atraumatic.     Mouth/Throat:     Pharynx: Posterior oropharyngeal erythema present. No oropharyngeal exudate.     Tonsils: No tonsillar exudate or tonsillar abscesses.     Comments: No oropharyngeal vesicular lesions.  Posterior oropharyngeal erythema with no exudate, no evidence of PTA, good range of motion of the neck Eyes:     Conjunctiva/sclera: Conjunctivae normal.  Cardiovascular:     Rate and Rhythm: Normal rate and regular rhythm.     Heart sounds: No murmur heard. Pulmonary:     Effort: Pulmonary effort is normal. No respiratory distress.     Breath sounds: Normal breath sounds.  Abdominal:     Palpations: Abdomen is soft.     Tenderness: There is no abdominal tenderness.  Musculoskeletal:        General: No swelling.     Cervical back: Neck supple.  Skin:    General: Skin is warm and dry.     Capillary Refill: Capillary refill takes less than 2 seconds.  Neurological:     Mental  Status: She is alert.  Psychiatric:        Mood and Affect: Mood normal.     ED Results / Procedures / Treatments   Labs (all labs ordered are listed, but only abnormal results are displayed) Labs Reviewed  HIV ANTIBODY (ROUTINE TESTING W REFLEX)  RPR  HSV 1 ANTIBODY, IGG  HSV 2 ANTIBODY, IGG    EKG None  Radiology No results found.  Procedures Procedures    Medications Ordered in ED Medications - No data to display  ED Course/ Medical Decision Making/ A&P                             Medical Decision Making Amount and/or Complexity of Data Reviewed Labs: ordered.    32 year old female presenting to the emerged department with a sore throat for the past 4 days.  She states that she had a new sexual partner recently and was worried that she had HSV.  She presented to the emergency department  yesterday and was positive by PCR testing for influenza.  She has had a flulike illness with a sore throat and bodyaches for the past 4 days in total.  She is tolerating oral intake.  She denies any cough, fever or chills.  She is worried about STIs and consents to STI testing.  He has no dysuria, vaginal discharge, pelvic pain and declines gonorrhea chlamydia testing. She is unsure if her new sexual partner was positive for HSV.  Arrival, the patient was vitally stable.  Physical exam significant for No oropharyngeal vesicular lesions.  Posterior oropharyngeal erythema with no exudate, no evidence of PTA, good range of motion of the neck.  Patient positive for influenza A which explains her presentation.  She request STI testing for HSV.  I explained to her that she has no vesicular lesions for which I could obtain a PCR swab however can obtain antibody testing as a screening mechanism.  The patient consents to HIV and RPR testing as well.  Declines GC/chlamydia testing and is asymptomatic.  Declines any empiric treatment for STIs.    Patient outside the window for Tamiflu, advised symptomatic management for her influenza to include Tylenol and ibuprofen for muscle aches and fever, continued oral rehydration.  Advised that the results of her STI testing will be available on the patient portal.  Stable for discharge.   Final Clinical Impression(s) / ED Diagnoses Final diagnoses:  Influenza A  Sexually transmitted disease counseling    Rx / DC Orders ED Discharge Orders     None         Regan Lemming, MD 06/13/22 1133

## 2022-06-13 NOTE — ED Triage Notes (Signed)
Reports persistent sore throat , was seen yesterday and left after triage , positive for flu , would like more testing she said as HSV .

## 2022-06-14 LAB — HSV 1 ANTIBODY, IGG: HSV 1 Glycoprotein G Ab, IgG: 30 index — ABNORMAL HIGH (ref 0.00–0.90)

## 2022-06-14 LAB — RPR: RPR Ser Ql: NONREACTIVE

## 2022-06-14 LAB — HSV 2 ANTIBODY, IGG: HSV 2 Glycoprotein G Ab, IgG: <0.91 index (ref 0.00–0.90)

## 2022-06-15 DIAGNOSIS — Z6824 Body mass index (BMI) 24.0-24.9, adult: Secondary | ICD-10-CM | POA: Diagnosis not present

## 2022-06-15 DIAGNOSIS — A5400 Gonococcal infection of lower genitourinary tract, unspecified: Secondary | ICD-10-CM | POA: Diagnosis not present

## 2022-06-15 DIAGNOSIS — A5900 Urogenital trichomoniasis, unspecified: Secondary | ICD-10-CM | POA: Diagnosis not present

## 2022-07-04 ENCOUNTER — Encounter: Payer: 59 | Admitting: Podiatry

## 2022-07-21 ENCOUNTER — Emergency Department (HOSPITAL_BASED_OUTPATIENT_CLINIC_OR_DEPARTMENT_OTHER): Payer: 59

## 2022-07-21 ENCOUNTER — Encounter (HOSPITAL_BASED_OUTPATIENT_CLINIC_OR_DEPARTMENT_OTHER): Payer: Self-pay | Admitting: Urology

## 2022-07-21 ENCOUNTER — Emergency Department (HOSPITAL_BASED_OUTPATIENT_CLINIC_OR_DEPARTMENT_OTHER)
Admission: EM | Admit: 2022-07-21 | Discharge: 2022-07-22 | Discharge status: Against Medical Advice | Payer: 59 | Attending: Emergency Medicine | Admitting: Emergency Medicine

## 2022-07-21 DIAGNOSIS — Z91040 Latex allergy status: Secondary | ICD-10-CM | POA: Insufficient documentation

## 2022-07-21 DIAGNOSIS — Z5321 Procedure and treatment not carried out due to patient leaving prior to being seen by health care provider: Secondary | ICD-10-CM | POA: Diagnosis not present

## 2022-07-21 DIAGNOSIS — X500XXA Overexertion from strenuous movement or load, initial encounter: Secondary | ICD-10-CM | POA: Diagnosis not present

## 2022-07-21 DIAGNOSIS — M5459 Other low back pain: Secondary | ICD-10-CM | POA: Diagnosis not present

## 2022-07-21 DIAGNOSIS — M545 Low back pain, unspecified: Secondary | ICD-10-CM | POA: Insufficient documentation

## 2022-07-21 LAB — PREGNANCY, URINE: Preg Test, Ur: NEGATIVE
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
## 2022-07-22 ENCOUNTER — Other Ambulatory Visit: Payer: Self-pay

## 2022-07-22 ENCOUNTER — Emergency Department (HOSPITAL_BASED_OUTPATIENT_CLINIC_OR_DEPARTMENT_OTHER)
Admission: EM | Admit: 2022-07-22 | Discharge: 2022-07-22 | Disposition: A | Discharge status: Home / Self Care | Payer: 59 | Source: Home / Self Care | Attending: Emergency Medicine | Admitting: Emergency Medicine

## 2022-07-22 ENCOUNTER — Encounter (HOSPITAL_BASED_OUTPATIENT_CLINIC_OR_DEPARTMENT_OTHER): Payer: Self-pay | Admitting: Emergency Medicine

## 2022-07-22 DIAGNOSIS — Z91040 Latex allergy status: Secondary | ICD-10-CM | POA: Insufficient documentation

## 2022-07-22 DIAGNOSIS — X500XXA Overexertion from strenuous movement or load, initial encounter: Secondary | ICD-10-CM | POA: Insufficient documentation

## 2022-07-22 DIAGNOSIS — M545 Low back pain, unspecified: Secondary | ICD-10-CM | POA: Insufficient documentation

## 2022-07-22 DIAGNOSIS — M5459 Other low back pain: Secondary | ICD-10-CM | POA: Diagnosis not present

## 2022-07-22 MED ORDER — NAPROXEN 500 MG PO TABS: 500.0000 mg | ORAL_TABLET | Freq: Two times a day (BID) | ORAL | 0 refills | Status: DC
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
## 2022-07-22 NOTE — Discharge Instructions (Signed)
You were evaluated today for low back pain.  Your symptoms are consistent with a low back strain.  Please continue to use heat over the affected area.  I also recommend taking anti-inflammatory medication.  I have prescribed Naprosyn.  Do not take other NSAID medications while taking this medication.  You may continue to take Tylenol as needed.  Please keep your follow-up appointment this week with primary care.

## 2022-07-22 NOTE — ED Triage Notes (Signed)
Patient c/o low back pain x 1 week.

## 2022-07-26 ENCOUNTER — Emergency Department (HOSPITAL_BASED_OUTPATIENT_CLINIC_OR_DEPARTMENT_OTHER)
Admission: EM | Admit: 2022-07-26 | Discharge: 2022-07-26 | Disposition: A | Discharge status: Home / Self Care | Payer: 59 | Attending: Emergency Medicine | Admitting: Emergency Medicine

## 2022-07-26 ENCOUNTER — Other Ambulatory Visit: Payer: Self-pay

## 2022-07-26 ENCOUNTER — Encounter (HOSPITAL_BASED_OUTPATIENT_CLINIC_OR_DEPARTMENT_OTHER): Payer: Self-pay | Admitting: Pediatrics

## 2022-07-26 DIAGNOSIS — J020 Streptococcal pharyngitis: Secondary | ICD-10-CM | POA: Diagnosis not present

## 2022-07-26 DIAGNOSIS — M5459 Other low back pain: Secondary | ICD-10-CM | POA: Diagnosis not present

## 2022-07-26 DIAGNOSIS — M545 Low back pain, unspecified: Secondary | ICD-10-CM | POA: Insufficient documentation

## 2022-07-26 DIAGNOSIS — R0902 Hypoxemia: Secondary | ICD-10-CM | POA: Diagnosis not present

## 2022-07-26 DIAGNOSIS — Z91040 Latex allergy status: Secondary | ICD-10-CM | POA: Diagnosis not present

## 2022-07-26 DIAGNOSIS — R1111 Vomiting without nausea: Secondary | ICD-10-CM | POA: Diagnosis not present

## 2022-07-26 DIAGNOSIS — R070 Pain in throat: Secondary | ICD-10-CM | POA: Diagnosis not present

## 2022-07-26 DIAGNOSIS — M549 Dorsalgia, unspecified: Secondary | ICD-10-CM | POA: Diagnosis not present

## 2022-07-26 LAB — URINALYSIS, MICROSCOPIC (REFLEX): WBC, UA: NONE SEEN WBC/hpf (ref 0–5)

## 2022-07-26 LAB — GROUP A STREP BY PCR: Group A Strep by PCR: NOT DETECTED

## 2022-07-26 LAB — URINALYSIS, ROUTINE W REFLEX MICROSCOPIC
Bilirubin Urine: NEGATIVE
Glucose, UA: NEGATIVE mg/dL
Ketones, ur: >=80 mg/dL — AB
Leukocytes,Ua: NEGATIVE
Nitrite: NEGATIVE
Protein, ur: 100 mg/dL — AB
Specific Gravity, Urine: 1.02 (ref 1.005–1.030)
pH: 8 (ref 5.0–8.0)

## 2022-07-26 MED ORDER — METHOCARBAMOL 500 MG PO TABS: 500.0000 mg | ORAL_TABLET | Freq: Two times a day (BID) | ORAL | 0 refills | Status: DC

## 2022-07-26 MED ORDER — LIDOCAINE 5 % EX PTCH: 1.0000 | MEDICATED_PATCH | CUTANEOUS | 0 refills | Status: DC

## 2022-07-26 MED ORDER — AMOXICILLIN 500 MG PO CAPS: 500.0000 mg | ORAL_CAPSULE | Freq: Two times a day (BID) | ORAL | 0 refills | Status: AC

## 2022-07-26 MED ORDER — IBUPROFEN 400 MG PO TABS
600.0000 mg | ORAL_TABLET | Freq: Once | ORAL | Status: AC
  Administered 2022-07-26: 600 mg via ORAL
  Filled 2022-07-26: qty 1

## 2022-07-26 MED ORDER — LIDOCAINE 5 % EX PTCH
1.0000 | MEDICATED_PATCH | Freq: Once | CUTANEOUS | Status: DC
  Administered 2022-07-26: 1 via TRANSDERMAL
  Filled 2022-07-26: qty 1

## 2022-07-26 MED ORDER — ACETAMINOPHEN 500 MG PO TABS: 1000.0000 mg | ORAL_TABLET | Freq: Once | ORAL | Status: DC

## 2022-07-26 MED ORDER — DEXAMETHASONE SODIUM PHOSPHATE 10 MG/ML IJ SOLN
10.0000 mg | Freq: Once | INTRAMUSCULAR | Status: AC
  Administered 2022-07-26: 10 mg via INTRAMUSCULAR
  Filled 2022-07-26: qty 1

## 2022-07-26 NOTE — ED Triage Notes (Signed)
Reports was seen here for back pain on the 7th, prescribed naproxen with no relief. States fever last night along with throat pain, reports vomiting today also.

## 2022-07-26 NOTE — ED Provider Notes (Signed)
[place] EMERGENCY DEPARTMENT AT [facility] Provider Note   CSN: [id number] Arrival date & time: 07/26/22  1346     History  Chief Complaint  Patient presents with   Back Pain   Sore Throat    Catherine Weber is a 31 y.o. female who presents to the ED with concerns for sore throat onset yesterday. Denies known sick contacts. No meds tried PTA for her sore throat. Denies trouble swallowing, rhinorrhea, nasal congestion.   Patient also notes left-sided back pain.  She notes that she came to emergency department on 4/12 however left without being seen following triage.  Also notes that she was evaluated on 4/13 for her back pain.  At that time had negative x-ray studies.  Patient is that she was given a prescription for Naprosyn. She notes that she was previously evaluated by [facility] for her back and spasms that she has had before in the past.  Notes that she was released by [facility] to continue with physical therapy.  Notes that she has not called them regarding her ED visit. She has an appointment with her primary care provider today regarding her ED visits. Denies urinary symptoms, numbness, tingling, weakness, saddle paresthesia, bowel/bladder incontinence.  The history is provided by the patient. No language interpreter was used.       Home Medications Prior to Admission medications   Medication Sig Start Date End Date Taking? Authorizing Provider  amoxicillin (AMOXIL) 500 MG capsule Take 1 capsule (500 mg total) by mouth 2 (two) times daily for 10 days. 07/26/22 08/05/22 Yes Ruhee Enck A, PA-C  lidocaine (LIDODERM) 5 % Place 1 patch onto the skin daily. Remove & Discard patch within 12 hours or as directed by MD 07/26/22  Yes Delonta Yohannes A, PA-C  methocarbamol (ROBAXIN) 500 MG tablet Take 1 tablet (500 mg total) by mouth 2 (two) times daily. 07/26/22  Yes Maily Debarge A, PA-C  cephALEXin (KEFLEX) 500 MG capsule Take 1 capsule (500 mg total) by mouth 3 (three)  times daily. 11/14/17   Hyatt, Max T, DPM  cephALEXin (KEFLEX) 500 MG capsule Take 1 capsule (500 mg total) by mouth 3 (three) times daily. 01/16/18   Hyatt, Max T, DPM  naproxen (NAPROSYN) 500 MG tablet Take 1 tablet (500 mg total) by mouth 2 (two) times daily. 07/22/22   Darrick Grinder, PA-C  ondansetron (ZOFRAN) 4 MG tablet Take 1 tablet (4 mg total) by mouth every 8 (eight) hours as needed for nausea or vomiting. 11/14/17   Hyatt, Max T, DPM  ondansetron (ZOFRAN) 4 MG tablet Take 1 tablet (4 mg total) by mouth every 8 (eight) hours as needed for nausea or vomiting. 01/16/18   Hyatt, Max T, DPM      Allergies    Flagyl [metronidazole] and Latex    Review of Systems   Review of Systems  Musculoskeletal:  Positive for back pain.  All other systems reviewed and are negative.   Physical Exam Updated Vital Signs BP 112/74 (BP Location: Left Arm)   Pulse 87   Temp 99.3 F (37.4 C) (Oral)   Resp 18   Ht  (1.651 m)   Wt 66.7 kg   LMP 07/26/2022   SpO2 100%   BMI 24.46 kg/m  Physical Exam Vitals and nursing note reviewed.  Constitutional:      General: She is not in acute distress.    Appearance: Normal appearance.  HENT:     Mouth/Throat:  Mouth: Mucous membranes are moist.     Pharynx: Oropharynx is clear. Uvula midline. Posterior oropharyngeal erythema present. No uvula swelling.     Tonsils: Tonsillar exudate present.     Comments: Uvula midline without swelling. Posterior pharyngeal erythema noted. Tonsillar exudate noted on right. Patent airway. Pt able to speak in clear complete sentences. Tolerating oral secretions. Eyes:     General: No scleral icterus.    Extraocular Movements: Extraocular movements intact.  Cardiovascular:     Rate and Rhythm: Normal rate and regular rhythm.     Pulses: Normal pulses.     Heart sounds: Normal heart sounds.  Pulmonary:     Effort: Pulmonary effort is normal. No respiratory distress.     Breath sounds: Normal breath sounds.   Abdominal:     Palpations: Abdomen is soft. There is no mass.     Tenderness: There is no abdominal tenderness.  Musculoskeletal:        General: Normal range of motion.     Cervical back: Neck supple.     Comments: TTP noted to left lower thoracic musculature of back. No spinal TTP. Normal resisted flexion and extension of BLE without difficulty. Sensation intact to BLE.   Skin:    General: Skin is warm and dry.     Findings: No rash.  Neurological:     Mental Status: She is alert.     Sensory: Sensation is intact.     Motor: Motor function is intact.  Psychiatric:        Behavior: Behavior normal.     ED Results / Procedures / Treatments   Labs (all labs ordered are listed, but only abnormal results are displayed) Labs Reviewed  URINALYSIS, ROUTINE W REFLEX MICROSCOPIC - Abnormal; Notable for the following components:      Result Value   Hgb urine dipstick MODERATE (*)    Ketones, ur >=80 (*)    Protein, ur 100 (*)    All other components within normal limits  URINALYSIS, MICROSCOPIC (REFLEX) - Abnormal; Notable for the following components:   Bacteria, UA RARE (*)    All other components within normal limits  GROUP A STREP BY PCR    EKG None  Radiology No results found.  Procedures Procedures    Medications Ordered in ED Medications  lidocaine (LIDODERM) 5 % 1 patch (1 patch Transdermal Patch Applied 07/26/22 1429)  dexamethasone (DECADRON) injection 10 mg (10 mg Intramuscular Given 07/26/22 1423)  ibuprofen (ADVIL) tablet 600 mg (600 mg Oral Given 07/26/22 1422)    ED Course/ Medical Decision Making/ A&P Clinical Course as of 07/26/22 1637  Wed Jul 26, 2022  1431 Pt re-evaluated and noted improvement of her symptoms with treatment in the ED. Discussed with patient that we are obtaining her UA to rule out  [SB]  1604 Re-evaluated and noted improvement of symptoms with treatment regimen. Discussed discharge treatment plan. Pt agreeable at this time. Pt appears  safe for discharge. [SB]    Clinical Course User Index [SB] Merwin Breden A, PA-C                             Medical Decision Making Amount and/or Complexity of Data Reviewed Labs: ordered.  Risk Prescription drug management.   Patient with sore throat onset yesterday.  Denies sick contacts.  On exam patient with uvula midline without swelling.  Posterior pharyngeal erythema noted.  Tonsillar exudate noted on the right.  Patent airway.  Patient able to speak in clear complete sentences.  Tolerating oral secretions.  Tenderness to palpation noted to left lower thoracic musculature of back.  No spinal tenderness to palpation.  Normal resisted flexion and extension of bilateral lower extremities without difficulty.  Sensation intact to bilateral lower extremities.  Differential diagnosis includes strep pharyngitis, peritonsillar abscess, strep pharyngitis, Ludwig's angina, fracture, dislocation, herniation, cauda equina, pyelonephritis, nephrolithiasis.  Labs:  I ordered, and personally interpreted labs.  The pertinent results include:   Strep swab positive Urinalysis notable for moderate hemoglobin (patient is on the first day of her menstrual cycle)  Medications:  I ordered medication including Decadron, ibuprofen, Lidoderm patch, warm compress for symptom management. Reevaluation of the patient after these medicines and interventions, I reevaluated the patient and found that they have improved I have reviewed the patients home medicines and have made adjustments as needed Pt tolerating PO intake in the ED without difficulty.    Disposition: Pt presentation suspicious for strep pharyngitis, although negative strep swab on exam today, physical exam concerning for strep pharyngitis. Doubt COVID or flu at this time. Patent airway, tolerating secretions, no concern for airway compromise. Less likely Ludwigs angina, no trismus or edema to floor of mouth on exam. Less likely peritonsillar  abscess, no fluctuant abscess noted on exam, patent airway, oxygen saturation 100%, water and tolerating secretions.  Doubt concerns at this time for fracture, dislocation, herniation, cauda equina.  Patient ambulatory without assistance or difficulty in the emergency department.  Patient symptoms improved with treatment regimen in the ED. After consideration of the diagnostic results and the patients response to treatment, I feel that the patient would benefit from Discharge home.  Patient discharged home with a prescription for amoxicillin, Robaxin, Lidoderm patch.  Patient instructed to follow-up with her primary care provider.  Patient also instructed to follow-up with her orthopedist at [facility] regarding today's ED visit.  Work note provided.  Patient acknowledges and verbalizes understanding.  Recommended primary care follow-up.  Patient appears safe for discharge at this time.  Follow-up as indicated in discharge paperwork.  This chart was dictated using voice recognition software, Dragon. Despite the best efforts of this provider to proofread and correct errors, errors may still occur which can change documentation meaning.    Final Clinical Impression(s) / ED Diagnoses Final diagnoses:  Acute left-sided low back pain without sciatica  Strep pharyngitis    Rx / DC Orders ED Discharge Orders          Ordered    methocarbamol (ROBAXIN) 500 MG tablet  2 times daily        07/26/22 1612    lidocaine (LIDODERM) 5 %  Every 24 hours        07/26/22 1612    amoxicillin (AMOXIL) 500 MG capsule  2 times daily        07/26/22 1612              Ivannah Zody A, PA-C 07/26/22 1638    Margarita Grizzle, MD 07/28/22 [phone]

## 2022-07-26 NOTE — Discharge Instructions (Addendum)
It was a pleasure taking care of you today!  Your strep swab was negative in the ED however your physical exam is concerning for strep throat, you will be treated with amoxicillin, take as directed initially to complete the entire dose antibiotics.  Ensure to maintain fluid intake with water, tea, broth, Gatorade, Pedialyte.  Return to the emergency department if you are experiencing increasing/worsening symptoms.   For your back, call your orthopedist at [facility] to set up a follow-up appointment regarding today's ED visit.  You are prescribed Robaxin and lidoderm patches.  Take medications as prescribed.  Do not operate any heavy machinery or drive while taking Robaxin as it can make you sleepy/drowsy.  You will also be sent a prescription for lidoderm patch, remove and replace with a new patch every 12 hours. You may apply heat to the affected area for up to 15 minutes at a time.  Ensure to place a barrier between your skin and the heat. Return to the Emergency Department if you are experiencing loss of bowel or bladder, increasing/worsening symptoms, fever, inability to walk.

## 2022-07-26 NOTE — ED Notes (Signed)
Discharge instructions reviewed with patient. Patient verbalizes understanding, no further questions at this time. Medications/prescriptions and follow up information provided. No acute distress noted at time of departure.  

## 2022-10-19 ENCOUNTER — Other Ambulatory Visit: Payer: Self-pay

## 2022-10-19 ENCOUNTER — Encounter (HOSPITAL_BASED_OUTPATIENT_CLINIC_OR_DEPARTMENT_OTHER): Payer: Self-pay | Admitting: Emergency Medicine

## 2022-10-19 ENCOUNTER — Emergency Department (HOSPITAL_BASED_OUTPATIENT_CLINIC_OR_DEPARTMENT_OTHER)
Admission: EM | Admit: 2022-10-19 | Discharge: 2022-10-19 | Disposition: A | Discharge status: Home / Self Care | Payer: 59 | Attending: Emergency Medicine | Admitting: Emergency Medicine

## 2022-10-19 DIAGNOSIS — R112 Nausea with vomiting, unspecified: Secondary | ICD-10-CM | POA: Insufficient documentation

## 2022-10-19 DIAGNOSIS — R197 Diarrhea, unspecified: Secondary | ICD-10-CM | POA: Diagnosis not present

## 2022-10-19 DIAGNOSIS — Z5321 Procedure and treatment not carried out due to patient leaving prior to being seen by health care provider: Secondary | ICD-10-CM | POA: Diagnosis not present

## 2022-10-19 DIAGNOSIS — R519 Headache, unspecified: Secondary | ICD-10-CM | POA: Diagnosis not present

## 2022-10-19 NOTE — ED Notes (Signed)
Called patient to be brought back to room, however searched lobby, parking lot and down to vending machines and nobody answered. Patient LWBS.

## 2022-10-19 NOTE — ED Triage Notes (Signed)
Pt reports HA started last night and has not resolved even with OTC pain relievers, has not been able to eat today and is having n/v/d x 2 today, denies vision changes or dizziness; A&O x 4

## 2023-02-14 ENCOUNTER — Encounter: Payer: Self-pay | Admitting: Podiatry

## 2023-02-26 ENCOUNTER — Ambulatory Visit (INDEPENDENT_AMBULATORY_CARE_PROVIDER_SITE_OTHER): Payer: 59

## 2023-02-26 ENCOUNTER — Encounter: Payer: Self-pay | Admitting: Podiatry

## 2023-02-26 ENCOUNTER — Ambulatory Visit (INDEPENDENT_AMBULATORY_CARE_PROVIDER_SITE_OTHER): Payer: 59 | Admitting: Podiatry

## 2023-02-26 DIAGNOSIS — M778 Other enthesopathies, not elsewhere classified: Secondary | ICD-10-CM | POA: Diagnosis not present
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
## 2023-02-27 ENCOUNTER — Telehealth: Payer: Self-pay | Admitting: Podiatry

## 2023-02-27 NOTE — Telephone Encounter (Signed)
Faxed completed FMLA documents to [employer] (patient's employer) ....  Advised patient the same ....     J. Abbott -- 02/27/2023

## 2023-02-28 ENCOUNTER — Telehealth: Payer: Self-pay | Admitting: Podiatry

## 2023-02-28 NOTE — Telephone Encounter (Signed)
Pt called returning a call she received and would like to get scheduled for surgery, she's already signed her surgery consents with Dr.McDonald. The best number to reach her at is [phone]

## 2023-03-21 ENCOUNTER — Telehealth: Payer: Self-pay | Admitting: Podiatry

## 2023-03-21 NOTE — Telephone Encounter (Signed)
DOS-04/13/23  EXOSTECTOMY [id number]  [employer] EFFECTIVE DATE- 04/10/22  DEDUCTIBLE- $6200.00 WITH REMAINING $1,486.42  OOP-$7500.00 WITH REMAINING $2,786.42  COINSURANCE- 50%  SPOKE WITH MAHARNI C. FROM [employer] AND SHE STATED THAT PRIOR AUTH IS NOT REQUIRED FOR CPT CODE 45409.  CALL REFERENCE NUMBER: [id number]

## 2023-04-12 ENCOUNTER — Telehealth: Payer: Self-pay | Admitting: Podiatry

## 2023-04-12 NOTE — Telephone Encounter (Signed)
 [facility] called to let us know that the pt is cancelling her 04/13/23 surgery with Dr.McDonald. Called pt to confirm and she is cancelling her surgery, would like to reschedule a few months out due to finances. Dr.McDonald notified, and post ops cancelled.

## 2023-04-19 ENCOUNTER — Encounter: Payer: 59 | Admitting: Podiatry

## 2023-05-03 ENCOUNTER — Encounter: Payer: 59 | Admitting: Podiatry

## 2023-05-20 NOTE — Progress Notes (Signed)
This encounter was created in error - please disregard.

## 2023-05-22 DIAGNOSIS — Z5948 Other specified lack of adequate food: Secondary | ICD-10-CM | POA: Diagnosis not present

## 2023-05-22 DIAGNOSIS — I9581 Postprocedural hypotension: Secondary | ICD-10-CM | POA: Diagnosis not present

## 2023-05-22 DIAGNOSIS — Z6824 Body mass index (BMI) 24.0-24.9, adult: Secondary | ICD-10-CM | POA: Diagnosis not present

## 2023-05-24 ENCOUNTER — Encounter: Payer: 59 | Admitting: Podiatry

## 2023-09-11 IMAGING — MR MR TOES*L* WO/W CM
8 of 10 series · 32 of 40 positions shown · IV contrast (GADAVIST)
Comparison: Radiograph 06/16/2021

CLINICAL DATA: Bone mass or bone pain, foot, no prior imaging

EXAM:
MRI OF THE LEFT TOES WITHOUT AND WITH CONTRAST
TECHNIQUE: Multiplanar, multisequence MR imaging of the left was performed both
before and after administration of intravenous contrast.
CONTRAST:  6mL GADAVIST GADOBUTROL 1 MMOL/ML IV SOLN

[Series 3: T1 · coronal · 3.0mm · 0.34mm/px · 6 of 44 slices shown (1 of 2)]
[im 1/44]
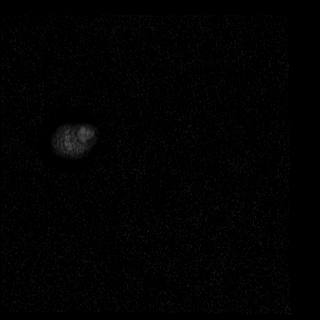
[im 9/44]
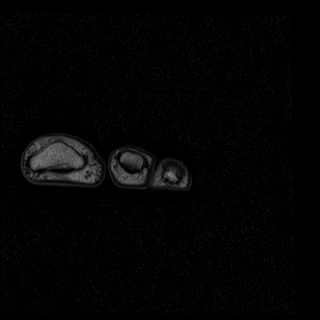
[im 18/44]
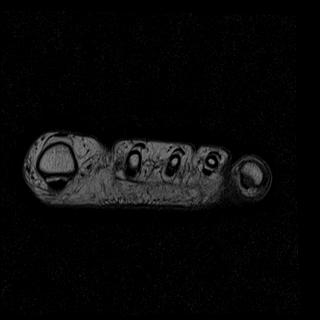
[im 26/44]
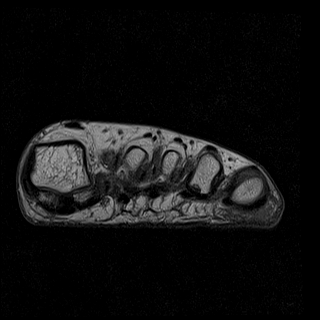
[im 35/44]
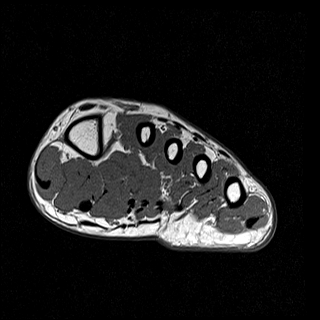
[im 44/44]
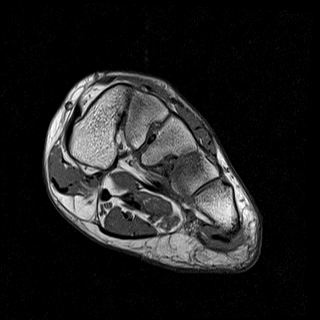

[Series 4: T2 fat-sat · coronal · 3.0mm · 0.34mm/px · 6 of 44 slices shown (1 of 3)]
[im 1/44]
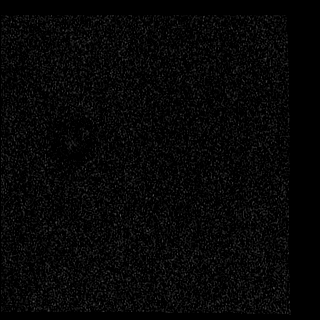
[im 9/44]
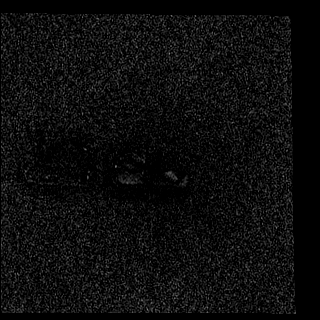
[im 18/44]
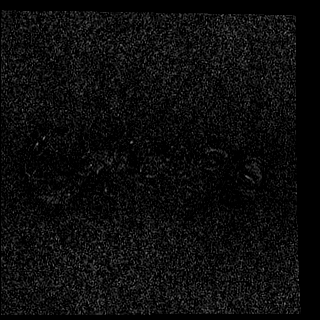
[im 26/44]
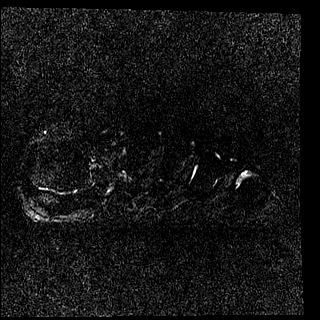
[im 35/44]
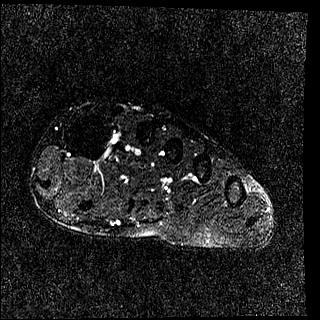
[im 44/44]
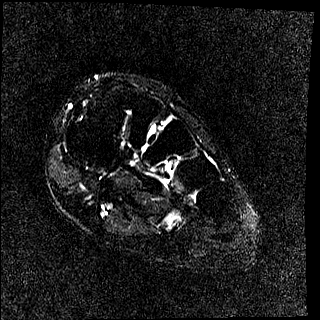

[Series 5: T1 · axial · 3.0mm · 0.62mm/px · z∈[-143,-75]mm · 2 of 19 slices shown (2 of 2)]
[im 1/19]
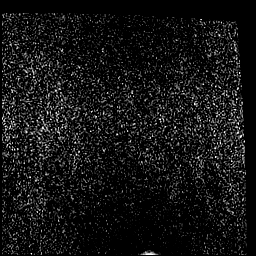
[im 19/19]
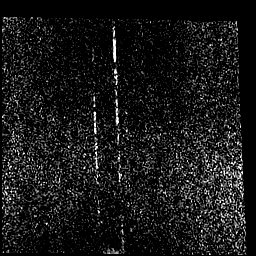

[Series 6: T2 fat-sat · axial · 3.0mm · 0.31mm/px · z∈[-143,-75]mm · 2 of 19 slices shown (2 of 3)]
[im 1/19]
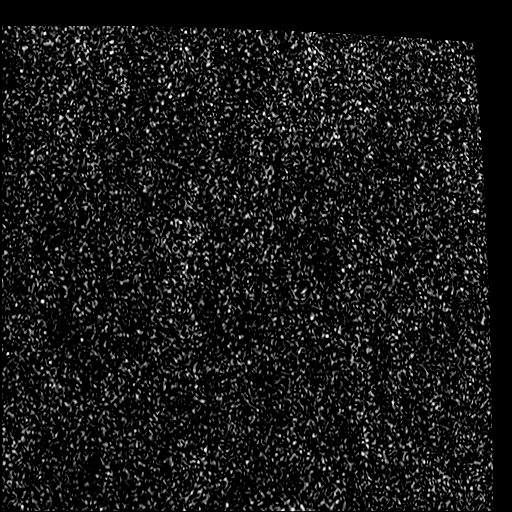
[im 19/19]
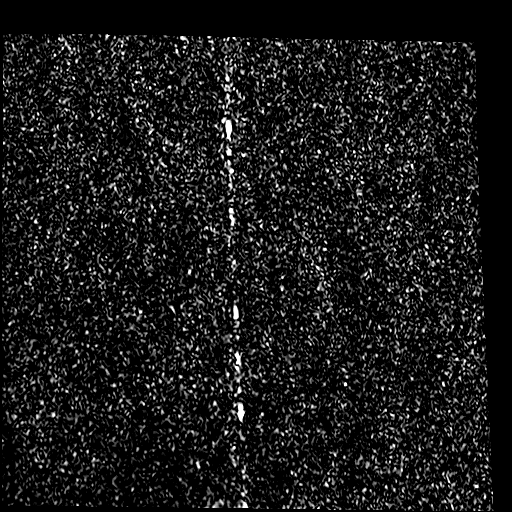

[Series 8: T2 fat-sat · coronal · 3.0mm · 0.43mm/px · 5 of 43 slices shown (3 of 3)]
[im 1/43]
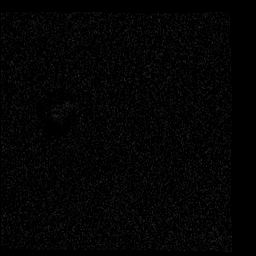
[im 11/43]
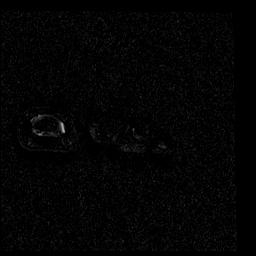
[im 22/43]
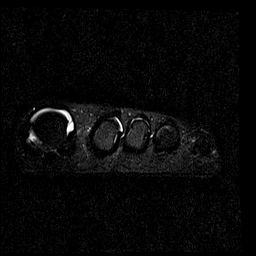
[im 32/43]
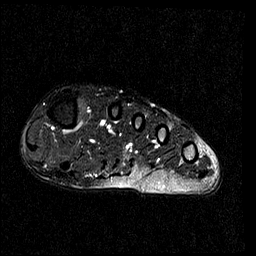
[im 43/43]
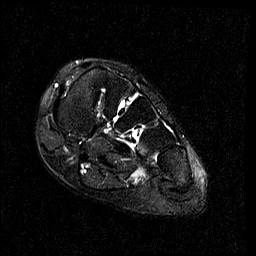

[Series 10: T1 fat-sat · coronal · non-contrast · 3.0mm · 0.21mm/px · 5 of 44 slices shown]
[im 1/44]
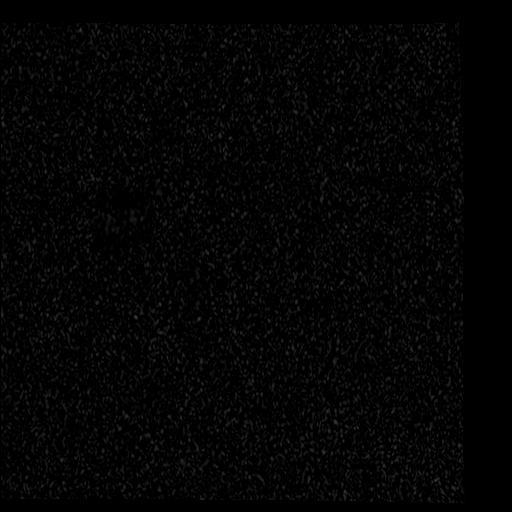
[im 11/44]
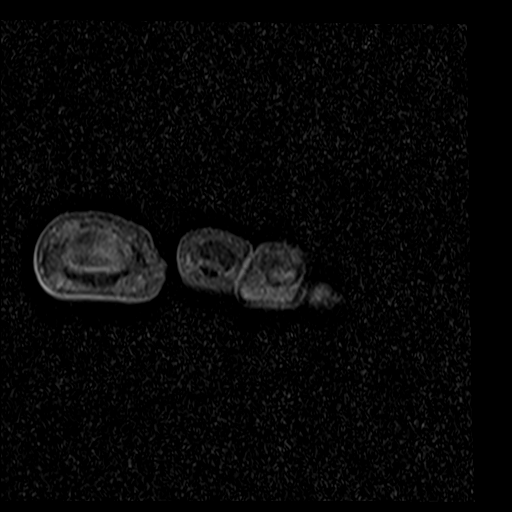
[im 22/44]
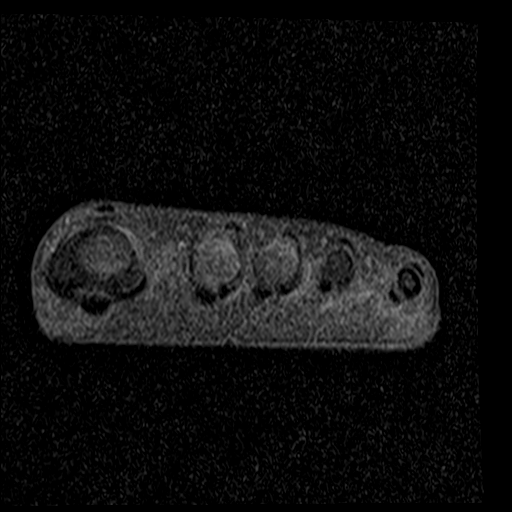
[im 33/44]
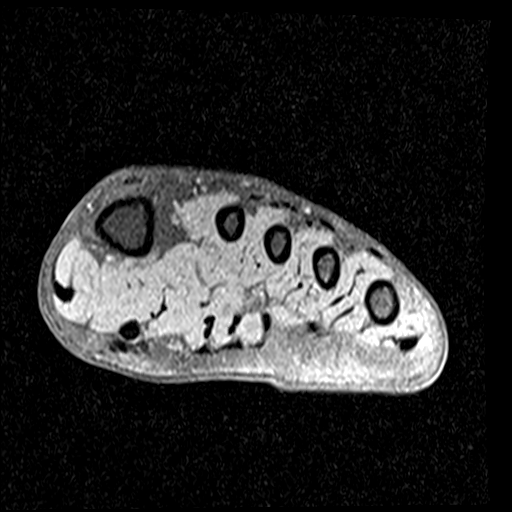
[im 44/44]
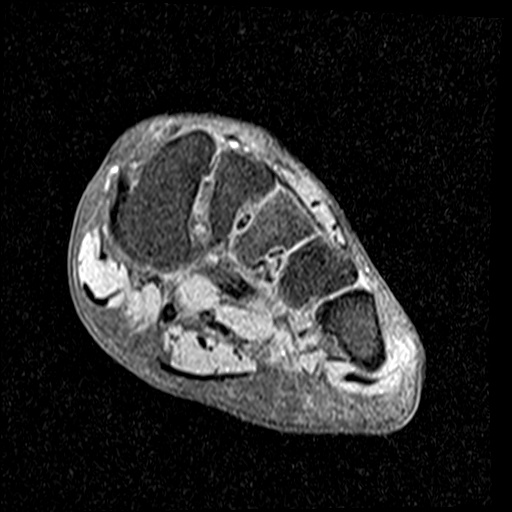

[Series 11: T1 fat-sat post-contrast · coronal · 3.0mm · 0.21mm/px · 5 of 44 slices shown (1 of 2)]
[im 1/44]
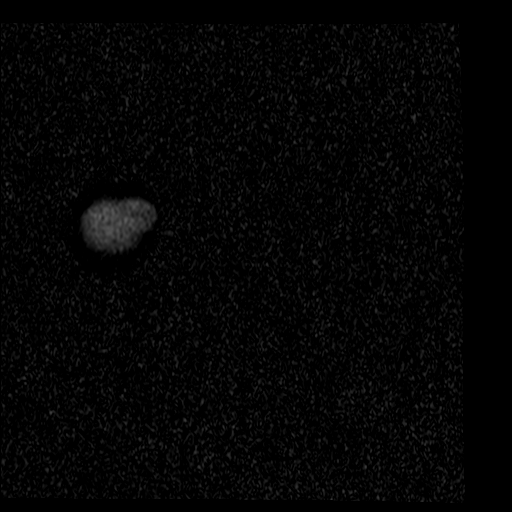
[im 11/44]
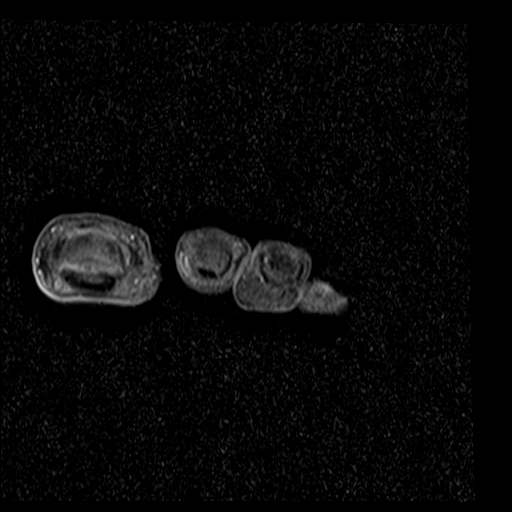
[im 22/44]
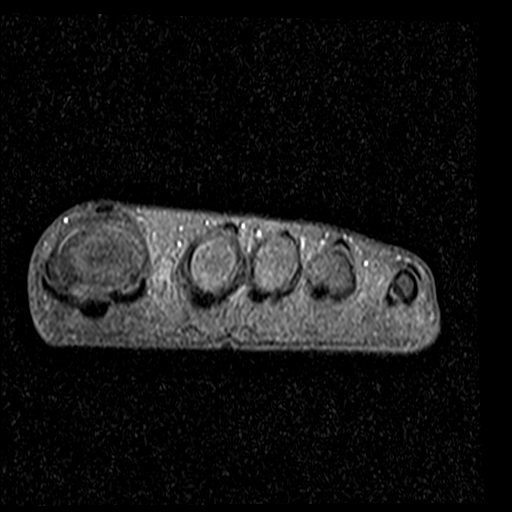
[im 33/44]
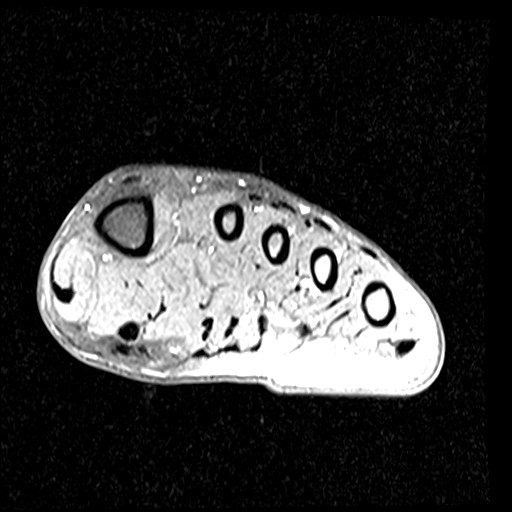
[im 44/44]
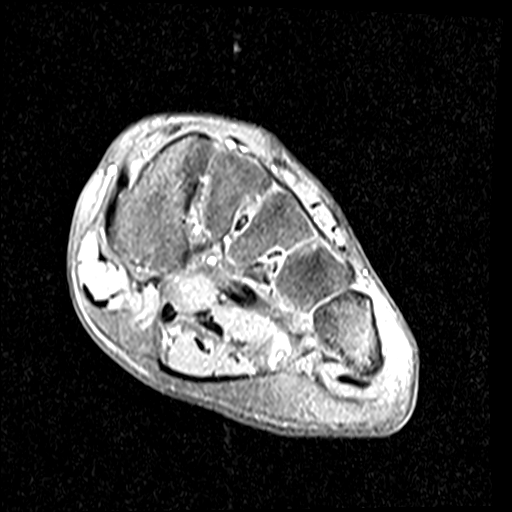

[Series 12: T1 fat-sat post-contrast · sagittal · 3.0mm · 0.62mm/px · 1 of 30 slices shown (2 of 2)]
[im 1/30]
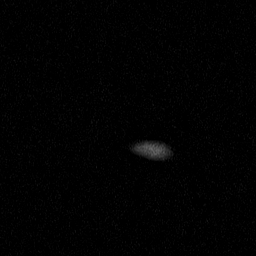

[32 of 40 positions shown; findings below may reference images not displayed]

FINDINGS: Bones/Joint/Cartilage

There is a subungual exostosis along the medial aspect of the great
toe distal phalangeal tuft. This measures 0.5 x 0.6 x 0.7 cm (axial
T1 image 43, coronal T1 image 4). There is likely reactive
associated marrow edema signal. No adjacent soft tissue mass. No
bony destruction. No cortical breakthrough.

Ligaments

Intact Lisfranc ligament. Intact MTP collateral ligaments. No
evidence of plantar plate tear.

Muscles and Tendons

No significant muscle atrophy or edema in the foot. No acute tendon
tear.

Soft tissues

No evidence of intermetatarsal neuroma or bursitis. No adventitial
bursitis. No focal fluid collection.
IMPRESSION: Subungual exostosis on the medial aspect of the great toe distal
phalangeal tuft measuring 0.5 x 0.6 x 0.7 cm. This is likely benign.
No aggressive features.

## 2023-10-02 DIAGNOSIS — R197 Diarrhea, unspecified: Secondary | ICD-10-CM | POA: Diagnosis not present

## 2023-10-02 DIAGNOSIS — A059 Bacterial foodborne intoxication, unspecified: Secondary | ICD-10-CM | POA: Diagnosis not present

## 2023-10-02 DIAGNOSIS — Z3A01 Less than 8 weeks gestation of pregnancy: Secondary | ICD-10-CM | POA: Diagnosis not present

## 2023-10-02 DIAGNOSIS — R109 Unspecified abdominal pain: Secondary | ICD-10-CM | POA: Diagnosis not present

## 2023-10-02 DIAGNOSIS — O219 Vomiting of pregnancy, unspecified: Secondary | ICD-10-CM | POA: Diagnosis not present

## 2023-10-02 DIAGNOSIS — O99611 Diseases of the digestive system complicating pregnancy, first trimester: Secondary | ICD-10-CM | POA: Diagnosis not present

## 2023-10-07 DIAGNOSIS — R634 Abnormal weight loss: Secondary | ICD-10-CM | POA: Diagnosis not present

## 2023-10-07 DIAGNOSIS — Z3A01 Less than 8 weeks gestation of pregnancy: Secondary | ICD-10-CM | POA: Diagnosis not present

## 2023-10-07 DIAGNOSIS — O219 Vomiting of pregnancy, unspecified: Secondary | ICD-10-CM | POA: Diagnosis not present

## 2023-10-07 DIAGNOSIS — O26891 Other specified pregnancy related conditions, first trimester: Secondary | ICD-10-CM | POA: Diagnosis not present

## 2023-10-17 DIAGNOSIS — N911 Secondary amenorrhea: Secondary | ICD-10-CM | POA: Diagnosis not present

## 2023-10-23 DIAGNOSIS — Z3490 Encounter for supervision of normal pregnancy, unspecified, unspecified trimester: Secondary | ICD-10-CM | POA: Insufficient documentation

## 2023-10-23 DIAGNOSIS — Z8669 Personal history of other diseases of the nervous system and sense organs: Secondary | ICD-10-CM | POA: Insufficient documentation

## 2023-11-03 ENCOUNTER — Emergency Department (HOSPITAL_COMMUNITY)
Admission: EM | Admit: 2023-11-03 | Discharge: 2023-11-03 | Disposition: A | Discharge status: Home / Self Care | Attending: Emergency Medicine | Admitting: Emergency Medicine

## 2023-11-03 ENCOUNTER — Other Ambulatory Visit: Payer: Self-pay

## 2023-11-03 ENCOUNTER — Encounter (HOSPITAL_COMMUNITY): Payer: Self-pay

## 2023-11-03 DIAGNOSIS — R8289 Other abnormal findings on cytological and histological examination of urine: Secondary | ICD-10-CM | POA: Diagnosis not present

## 2023-11-03 DIAGNOSIS — G43009 Migraine without aura, not intractable, without status migrainosus: Secondary | ICD-10-CM | POA: Diagnosis not present

## 2023-11-03 DIAGNOSIS — Z91040 Latex allergy status: Secondary | ICD-10-CM | POA: Insufficient documentation

## 2023-11-03 DIAGNOSIS — G43019 Migraine without aura, intractable, without status migrainosus: Secondary | ICD-10-CM | POA: Diagnosis not present

## 2023-11-03 DIAGNOSIS — O26891 Other specified pregnancy related conditions, first trimester: Secondary | ICD-10-CM | POA: Diagnosis not present

## 2023-11-03 DIAGNOSIS — O99351 Diseases of the nervous system complicating pregnancy, first trimester: Secondary | ICD-10-CM | POA: Diagnosis not present

## 2023-11-03 DIAGNOSIS — Z3A12 12 weeks gestation of pregnancy: Secondary | ICD-10-CM | POA: Diagnosis not present

## 2023-11-03 LAB — CBC
HCT: 38.1 % (ref 36.0–46.0)
Hemoglobin: 12.4 g/dL (ref 12.0–15.0)
MCH: 27.9 pg (ref 26.0–34.0)
MCHC: 32.5 g/dL (ref 30.0–36.0)
MCV: 85.6 fL (ref 80.0–100.0)
Platelets: 235 K/uL (ref 150–400)
RBC: 4.45 MIL/uL (ref 3.87–5.11)
RDW: 12.4 % (ref 11.5–15.5)
WBC: 7 K/uL (ref 4.0–10.5)
nRBC: 0 % (ref 0.0–0.2)

## 2023-11-03 LAB — URINALYSIS, ROUTINE W REFLEX MICROSCOPIC
Bilirubin Urine: NEGATIVE
Glucose, UA: NEGATIVE mg/dL
Hgb urine dipstick: NEGATIVE
Ketones, ur: 20 mg/dL — AB
Leukocytes,Ua: NEGATIVE
Nitrite: NEGATIVE
Protein, ur: NEGATIVE mg/dL
Specific Gravity, Urine: 1.017 (ref 1.005–1.030)
pH: 6 (ref 5.0–8.0)

## 2023-11-03 LAB — COMPREHENSIVE METABOLIC PANEL WITH GFR
ALT: 9 U/L (ref 0–44)
AST: 16 U/L (ref 15–41)
Albumin: 3.8 g/dL (ref 3.5–5.0)
Alkaline Phosphatase: 34 U/L — ABNORMAL LOW (ref 38–126)
Anion gap: 10 (ref 5–15)
BUN: 7 mg/dL (ref 6–20)
CO2: 22 mmol/L (ref 22–32)
Calcium: 10 mg/dL (ref 8.9–10.3)
Chloride: 105 mmol/L (ref 98–111)
Creatinine, Ser: 0.59 mg/dL (ref 0.44–1.00)
GFR, Estimated: >60 mL/min (ref >60)
Glucose, Bld: 96 mg/dL (ref 70–99)
Potassium: 3.8 mmol/L (ref 3.5–5.1)
Sodium: 137 mmol/L (ref 135–145)
Total Bilirubin: 0.7 mg/dL (ref 0.0–1.2)
Total Protein: 8.1 g/dL (ref 6.5–8.1)

## 2023-11-03 LAB — LIPASE, BLOOD: Lipase: 33 U/L (ref 11–51)

## 2023-11-03 MED ORDER — METOCLOPRAMIDE HCL 5 MG/ML IJ SOLN
10.0000 mg | Freq: Once | INTRAMUSCULAR | Status: AC
  Administered 2023-11-03: 10 mg via INTRAVENOUS
  Filled 2023-11-03: qty 2

## 2023-11-03 MED ORDER — ACETAMINOPHEN 500 MG PO TABS
1000.0000 mg | ORAL_TABLET | Freq: Once | ORAL | Status: AC
  Administered 2023-11-03: 1000 mg via ORAL
  Filled 2023-11-03: qty 2

## 2023-11-03 MED ORDER — LACTATED RINGERS IV BOLUS
1000.0000 mL | Freq: Once | INTRAVENOUS | Status: AC
  Administered 2023-11-03: 1000 mL via INTRAVENOUS
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
## 2023-11-03 NOTE — ED Triage Notes (Signed)
 Pt has c/o migraine and neck tension since last night, also nausea and vomiting this AM, pt is approx 3 months pregnant

## 2023-11-03 NOTE — Discharge Instructions (Addendum)
 Stay cool and hydrated.  Do small frequent meals.

## 2023-11-06 ENCOUNTER — Encounter

## 2023-11-20 ENCOUNTER — Encounter

## 2023-11-21 ENCOUNTER — Encounter (HOSPITAL_COMMUNITY): Payer: Self-pay

## 2023-11-21 ENCOUNTER — Emergency Department (HOSPITAL_COMMUNITY)
Admission: EM | Admit: 2023-11-21 | Discharge: 2023-11-22 | Disposition: A | Discharge status: Home / Self Care | Attending: Emergency Medicine | Admitting: Emergency Medicine

## 2023-11-21 ENCOUNTER — Other Ambulatory Visit: Payer: Self-pay

## 2023-11-21 DIAGNOSIS — Z3492 Encounter for supervision of normal pregnancy, unspecified, second trimester: Secondary | ICD-10-CM

## 2023-11-21 DIAGNOSIS — O26891 Other specified pregnancy related conditions, first trimester: Secondary | ICD-10-CM | POA: Diagnosis not present

## 2023-11-21 DIAGNOSIS — F4321 Adjustment disorder with depressed mood: Secondary | ICD-10-CM | POA: Diagnosis not present

## 2023-11-21 DIAGNOSIS — F4320 Adjustment disorder, unspecified: Secondary | ICD-10-CM

## 2023-11-21 DIAGNOSIS — O99341 Other mental disorders complicating pregnancy, first trimester: Secondary | ICD-10-CM | POA: Diagnosis not present

## 2023-11-21 DIAGNOSIS — E876 Hypokalemia: Secondary | ICD-10-CM | POA: Insufficient documentation

## 2023-11-21 DIAGNOSIS — O99281 Endocrine, nutritional and metabolic diseases complicating pregnancy, first trimester: Secondary | ICD-10-CM | POA: Diagnosis not present

## 2023-11-21 DIAGNOSIS — O99342 Other mental disorders complicating pregnancy, second trimester: Secondary | ICD-10-CM | POA: Diagnosis not present

## 2023-11-21 DIAGNOSIS — Z91040 Latex allergy status: Secondary | ICD-10-CM | POA: Diagnosis not present

## 2023-11-21 DIAGNOSIS — Z3A14 14 weeks gestation of pregnancy: Secondary | ICD-10-CM | POA: Insufficient documentation

## 2023-11-21 LAB — URINALYSIS, ROUTINE W REFLEX MICROSCOPIC
Bilirubin Urine: NEGATIVE
Glucose, UA: NEGATIVE mg/dL
Hgb urine dipstick: NEGATIVE
Ketones, ur: NEGATIVE mg/dL
Leukocytes,Ua: NEGATIVE
Nitrite: NEGATIVE
Protein, ur: NEGATIVE mg/dL
Specific Gravity, Urine: 1.018 (ref 1.005–1.030)
pH: 6 (ref 5.0–8.0)

## 2023-11-21 LAB — BASIC METABOLIC PANEL WITH GFR
Anion gap: 8 (ref 5–15)
BUN: 9 mg/dL (ref 6–20)
CO2: 23 mmol/L (ref 22–32)
Calcium: 9 mg/dL (ref 8.9–10.3)
Chloride: 103 mmol/L (ref 98–111)
Creatinine, Ser: 0.6 mg/dL (ref 0.44–1.00)
GFR, Estimated: >60 mL/min (ref >60)
Glucose, Bld: 67 mg/dL — ABNORMAL LOW (ref 70–99)
Potassium: 3.4 mmol/L — ABNORMAL LOW (ref 3.5–5.1)
Sodium: 134 mmol/L — ABNORMAL LOW (ref 135–145)

## 2023-11-21 LAB — CBC WITH DIFFERENTIAL/PLATELET
Abs Immature Granulocytes: 0.02 K/uL (ref 0.00–0.07)
Basophils Absolute: 0 K/uL (ref 0.0–0.1)
Basophils Relative: 0 %
Eosinophils Absolute: 0.1 K/uL (ref 0.0–0.5)
Eosinophils Relative: 2 %
HCT: 36.1 % (ref 36.0–46.0)
Hemoglobin: 11.6 g/dL — ABNORMAL LOW (ref 12.0–15.0)
Immature Granulocytes: 0 %
Lymphocytes Relative: 24 %
Lymphs Abs: 1.9 K/uL (ref 0.7–4.0)
MCH: 28.1 pg (ref 26.0–34.0)
MCHC: 32.1 g/dL (ref 30.0–36.0)
MCV: 87.4 fL (ref 80.0–100.0)
Monocytes Absolute: 0.5 K/uL (ref 0.1–1.0)
Monocytes Relative: 6 %
Neutro Abs: 5.4 K/uL (ref 1.7–7.7)
Neutrophils Relative %: 68 %
Platelets: 235 K/uL (ref 150–400)
RBC: 4.13 MIL/uL (ref 3.87–5.11)
RDW: 13.2 % (ref 11.5–15.5)
WBC: 8 K/uL (ref 4.0–10.5)
nRBC: 0 % (ref 0.0–0.2)

## 2023-11-21 LAB — CBG MONITORING, ED: Glucose-Capillary: 117 mg/dL — ABNORMAL HIGH (ref 70–99)

## 2023-11-21 LAB — HCG, QUANTITATIVE, PREGNANCY: hCG, Beta Chain, Quant, S: 164918 m[IU]/mL — ABNORMAL HIGH (ref <5)

## 2023-11-21 NOTE — ED Triage Notes (Signed)
 Pt presents to ED from home C/O anxiety regarding pregnancy. Pt is very tearful, reports her baby's father was killed and she wants to make sure her baby is okay. [redacted] weeks pregnant. Denies abdominal pain, vaginal bleeding, vaginal discharge. G2PO

## 2023-11-21 NOTE — ED Provider Notes (Signed)
 [place] EMERGENCY DEPARTMENT AT [facility] Provider Note   CSN: [id number] Arrival date & time: 11/21/23  8158     Patient presents with: Anxiety   Catherine Weber is a 33 y.o. female.   33 year old female presents with concern for her unborn child.  Patient is approximately [redacted] weeks pregnant.  Notes that she had an ultrasound at 8 weeks with her OB showing IUP with positive cardiac activity.  Patient was then transferred to the [facility] and was scheduled to be seen yesterday however she is struggling with grief following the death of her significant other 2 weeks ago and was unable to make the appointment.  She is concerned that her decreased oral intake will negatively impact her unborn child and presents for evaluation.  She denies suicidal or homicidal ideation.  States that when she gets very upset she feels like she is having abdominal cramping otherwise denies changes in bowel or bladder habits, vaginal bleeding or discharge.       Prior to Admission medications   Medication Sig Start Date End Date Taking? Authorizing Provider  cephALEXin  (KEFLEX ) 500 MG capsule Take 1 capsule (500 mg total) by mouth 3 (three) times daily. 11/14/17   Hyatt, Max T, DPM  cephALEXin  (KEFLEX ) 500 MG capsule Take 1 capsule (500 mg total) by mouth 3 (three) times daily. 01/16/18   Hyatt, Max T, DPM  lidocaine  (LIDODERM ) 5 % Place 1 patch onto the skin daily. Remove & Discard patch within 12 hours or as directed by MD 07/26/22   Blue, Soijett A, PA-C  methocarbamol  (ROBAXIN ) 500 MG tablet Take 1 tablet (500 mg total) by mouth 2 (two) times daily. 07/26/22   Blue, Soijett A, PA-C  naproxen  (NAPROSYN ) 500 MG tablet Take 1 tablet (500 mg total) by mouth 2 (two) times daily. 07/22/22   Logan Ubaldo NOVAK, PA-C  ondansetron  (ZOFRAN ) 4 MG tablet Take 1 tablet (4 mg total) by mouth every 8 (eight) hours as needed for nausea or vomiting. 11/14/17   Hyatt, Max T, DPM  ondansetron  (ZOFRAN ) 4 MG tablet Take 1  tablet (4 mg total) by mouth every 8 (eight) hours as needed for nausea or vomiting. 01/16/18   Hyatt, Max T, DPM    Allergies: Flagyl  [metronidazole ] and Latex    Review of Systems Negative except as per HPI Updated Vital Signs BP 110/80   Pulse 74   Temp 97.8 F (36.6 C)   Resp 19   Ht 5' 5 (1.651 m)   Wt 63.5 kg   SpO2 100%   BMI 23.30 kg/m   Physical Exam Vitals and nursing note reviewed.  Constitutional:      General: She is not in acute distress.    Appearance: She is well-developed. She is not diaphoretic.  HENT:     Head: Normocephalic and atraumatic.  Pulmonary:     Effort: Pulmonary effort is normal.  Abdominal:     Palpations: Abdomen is soft.     Tenderness: There is no abdominal tenderness.     Comments: gravid  Skin:    General: Skin is warm and dry.     Findings: No erythema or rash.  Neurological:     Mental Status: She is alert and oriented to person, place, and time.  Psychiatric:        Behavior: Behavior normal.     (all labs ordered are listed, but only abnormal results are displayed) Labs Reviewed  CBC WITH DIFFERENTIAL/PLATELET - Abnormal; Notable for the  following components:      Result Value   Hemoglobin 11.6 (*)    All other components within normal limits  BASIC METABOLIC PANEL WITH GFR - Abnormal; Notable for the following components:   Sodium 134 (*)    Potassium 3.4 (*)    Glucose, Bld 67 (*)    All other components within normal limits  HCG, QUANTITATIVE, PREGNANCY - Abnormal; Notable for the following components:   hCG, Beta Chain, Quant, S [id number] (*)    All other components within normal limits  URINALYSIS, ROUTINE W REFLEX MICROSCOPIC - Abnormal; Notable for the following components:   APPearance HAZY (*)    All other components within normal limits  CBG MONITORING, ED - Abnormal; Notable for the following components:   Glucose-Capillary 117 (*)    All other components within normal limits     EKG: None  Radiology: No results found.   Ultrasound ED OB Pelvic  Date/Time: 11/22/2023 12:01 AM  Performed by: Beverley Leita LABOR, PA-C Authorized by: Beverley Leita LABOR, PA-C   Procedure details:    Indications: evaluate for IUP     Assess:  Fetal viability   Technique:  Transabdominal obstetric (HCG+) exam   Images: not archived    Uterine findings:    Intrauterine pregnancy: identified     Single gestation: identified     Fetal heart rate: identified         Medications Ordered in the ED - No data to display                                  Medical Decision Making  33 year old female presents as above.  Abdomen is soft and nontender.  Bedside ultrasound reveals IUP with cardiac activity.  Fetal heart tones measured at 158.  Review of labs, CBC without significant findings.  Urinalysis is hazy but does not suggest infection.  hCG is positive.  Basic metabolic panel with mild hypokalemia at 3.4, mildly hypoglycemic at 67.  Patient request and is provided with food and drink.  Repeat CBG has improved to 117.  Patient plans to contact her OB tomorrow to discuss her situation and request earlier appointment.  She is provided to return for any worsening or concerning symptoms and discussed resources at women's over at [facility] should she need OB care.  Requested assistance from social work for follow-up care on this patient.     Final diagnoses:  Grief reaction  Pregnant and not yet delivered in second trimester    ED Discharge Orders     None          Beverley Leita LABOR, PA-C 11/22/23 CLAUDELL Carita Senior, MD 11/22/23 [phone]

## 2023-11-21 NOTE — Discharge Instructions (Signed)
 Follow-up with your OB, please call tomorrow to request earlier appointment. Return to the emergency room or go to [facility] women's and children's entrance see for any OB related needs.

## 2023-11-21 NOTE — ED Provider Triage Note (Signed)
 Emergency Medicine Provider Triage Evaluation Note  Catherine Weber , a 33 y.o. female  was evaluated in triage.  Pt complains of very anxious over the past week or so due to the death of her baby's father.  She is very tearful.  Denies any SI or HI.  She states she has not been eating well due to the grief.  She denies any abdominal pain, vaginal bleeding, vaginal discharge.  Review of Systems  Positive: As above Negative: As above  Physical Exam  BP 119/78 (BP Location: Right Arm)   Pulse 92   Temp 97.7 F (36.5 C) (Oral)   Resp 18   Ht 5' 5 (1.651 m)   Wt 63.5 kg   SpO2 100%   BMI 23.30 kg/m  Gen:   Awake, tearful Resp:  Normal effort  MSK:   Moves extremities without difficulty    Medical Decision Making  Medically screening exam initiated at 7:22 PM.  Appropriate orders placed.  Videl Nobrega was informed that the remainder of the evaluation will be completed by another provider, this initial triage assessment does not replace that evaluation, and the importance of remaining in the ED until their evaluation is complete.     Veta Palma, PA-C 11/21/23 1922

## 2023-11-22 DIAGNOSIS — O99341 Other mental disorders complicating pregnancy, first trimester: Secondary | ICD-10-CM | POA: Diagnosis not present

## 2023-12-04 ENCOUNTER — Inpatient Hospital Stay (HOSPITAL_COMMUNITY)
Admission: AD | Admit: 2023-12-04 | Discharge: 2023-12-04 | Discharge status: Against Medical Advice | Attending: Obstetrics and Gynecology | Admitting: Obstetrics and Gynecology

## 2023-12-04 NOTE — Progress Notes (Signed)
 Pt called and not in MAU lobby or outer lobby

## 2023-12-04 NOTE — Progress Notes (Signed)
Pt called and not in lobby 

## 2023-12-04 NOTE — Progress Notes (Signed)
 Pt not in lobby.

## 2023-12-05 ENCOUNTER — Inpatient Hospital Stay (HOSPITAL_COMMUNITY)
Admission: AD | Admit: 2023-12-05 | Discharge: 2023-12-05 | Disposition: A | Discharge status: Home / Self Care | Payer: Self-pay | Attending: Obstetrics and Gynecology | Admitting: Obstetrics and Gynecology

## 2023-12-05 DIAGNOSIS — R1031 Right lower quadrant pain: Secondary | ICD-10-CM | POA: Insufficient documentation

## 2023-12-05 DIAGNOSIS — B370 Candidal stomatitis: Secondary | ICD-10-CM | POA: Insufficient documentation

## 2023-12-05 DIAGNOSIS — O98812 Other maternal infectious and parasitic diseases complicating pregnancy, second trimester: Secondary | ICD-10-CM | POA: Diagnosis not present

## 2023-12-05 DIAGNOSIS — M7918 Myalgia, other site: Secondary | ICD-10-CM

## 2023-12-05 DIAGNOSIS — O99891 Other specified diseases and conditions complicating pregnancy: Secondary | ICD-10-CM | POA: Diagnosis not present

## 2023-12-05 DIAGNOSIS — O219 Vomiting of pregnancy, unspecified: Secondary | ICD-10-CM

## 2023-12-05 DIAGNOSIS — Z3A16 16 weeks gestation of pregnancy: Secondary | ICD-10-CM

## 2023-12-05 DIAGNOSIS — R102 Pelvic and perineal pain: Secondary | ICD-10-CM | POA: Diagnosis not present

## 2023-12-05 MED ORDER — NYSTATIN 100000 UNIT/ML MT SUSP: 500000.0000 [IU] | Freq: Four times a day (QID) | OROMUCOSAL | 0 refills | Status: DC

## 2023-12-05 MED ORDER — ONDANSETRON 4 MG PO TBDP: 4.0000 mg | ORAL_TABLET | Freq: Three times a day (TID) | ORAL | 2 refills | Status: DC | PRN
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
## 2023-12-05 NOTE — Discharge Instructions (Addendum)
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
## 2023-12-18 ENCOUNTER — Ambulatory Visit: Admitting: *Deleted

## 2023-12-18 ENCOUNTER — Other Ambulatory Visit (INDEPENDENT_AMBULATORY_CARE_PROVIDER_SITE_OTHER): Payer: Self-pay

## 2023-12-18 VITALS — BP 99/64 | HR 90 | Ht 64.75 in | Wt 142.6 lb

## 2023-12-18 DIAGNOSIS — Z3480 Encounter for supervision of other normal pregnancy, unspecified trimester: Secondary | ICD-10-CM | POA: Diagnosis not present

## 2023-12-18 DIAGNOSIS — G894 Chronic pain syndrome: Secondary | ICD-10-CM | POA: Insufficient documentation

## 2023-12-18 DIAGNOSIS — Z362 Encounter for other antenatal screening follow-up: Secondary | ICD-10-CM

## 2023-12-18 DIAGNOSIS — Z659 Problem related to unspecified psychosocial circumstances: Secondary | ICD-10-CM | POA: Insufficient documentation

## 2023-12-18 DIAGNOSIS — M5481 Occipital neuralgia: Secondary | ICD-10-CM | POA: Insufficient documentation

## 2023-12-18 DIAGNOSIS — Z1332 Encounter for screening for maternal depression: Secondary | ICD-10-CM | POA: Diagnosis not present

## 2023-12-18 DIAGNOSIS — Z3482 Encounter for supervision of other normal pregnancy, second trimester: Secondary | ICD-10-CM

## 2023-12-18 DIAGNOSIS — Z3A17 17 weeks gestation of pregnancy: Secondary | ICD-10-CM

## 2023-12-18 DIAGNOSIS — Z710 Person encountering health services to consult on behalf of another person: Secondary | ICD-10-CM | POA: Insufficient documentation

## 2023-12-18 DIAGNOSIS — M25519 Pain in unspecified shoulder: Secondary | ICD-10-CM | POA: Insufficient documentation

## 2023-12-18 DIAGNOSIS — M5020 Other cervical disc displacement, unspecified cervical region: Secondary | ICD-10-CM | POA: Insufficient documentation

## 2023-12-18 DIAGNOSIS — M5126 Other intervertebral disc displacement, lumbar region: Secondary | ICD-10-CM | POA: Insufficient documentation

## 2023-12-18 DIAGNOSIS — M542 Cervicalgia: Secondary | ICD-10-CM | POA: Insufficient documentation

## 2023-12-18 MED ORDER — VITAFOL GUMMIES 3.33-0.333-34.8 MG PO CHEW
1.0000 | CHEWABLE_TABLET | Freq: Every day | ORAL | 5 refills | Status: DC
Start: 2023-12-18 — End: 2024-01-10

## 2023-12-18 NOTE — Progress Notes (Signed)
 New OB Intake  I connected with Sharyle Ned  on 12/18/23 at  2:10 PM EDT by In Person Visit and verified that I am speaking with the correct person using two identifiers. Nurse is located at [facility] and pt is located at [place].  I discussed the limitations, risks, security and privacy concerns of performing an evaluation and management service by telephone and the availability of in person appointments. I also discussed with the patient that there may be a patient responsible charge related to this service. The patient expressed understanding and agreed to proceed.  I explained I am completing New OB Intake today. We discussed EDD of 05/18/24 based on US  at 8 weeks. Pt is G2P0010. I reviewed her allergies, medications and Medical/Surgical/OB history.    Patient Active Problem List   Diagnosis Date Noted   Bilateral occipital neuralgia 12/18/2023   Chronic pain syndrome 12/18/2023   Displacement of cervical intervertebral disc 12/18/2023   Displacement of lumbar intervertebral disc without myelopathy 12/18/2023   Neck pain 12/18/2023   Person encountering health services to consult on behalf of another person 12/18/2023   Problem related to unspecified psychosocial circumstances 12/18/2023   Shoulder joint pain 12/18/2023   History of migraine 10/23/2023   Pregnancy 10/23/2023     Concerns addressed today  Delivery Plans Plans to deliver at [facility]. Discussed the nature of our practice with multiple providers including residents and students as well as female and female providers. Due to the size of the practice, the delivering provider may not be the same as those providing prenatal care.   Patient is interested in water  birth.  MyChart/Babyscripts MyChart access verified. I explained pt will have some visits in office and some virtually. Babyscripts instructions given and order placed. Patient verifies receipt of registration text/e-mail. Account successfully created and app  downloaded. If patient is a candidate for Optimized scheduling, add to sticky note.   Blood Pressure Cuff/Weight Scale Blood pressure cuff ordered for patient to pick-up from [employer]. Explained after first prenatal appt pt will check weekly and document in Babyscripts. Patient does not have weight scale; patient may purchase if they desire to track weight weekly in Babyscripts.  Anatomy US  Explained first scheduled US  will be around 19 weeks. Anatomy US  scheduled for TBD at TBD.  Is patient a candidate for Babyscripts Optimization? No, due to Risk Factors.   First visit review I reviewed new OB appt with patient. Explained pt will be seen by Nidia Daring, NP at first visit. Discussed [place] genetic screening with patient. Declines Panorama and Horizon.. Routine prenatal labs collected at today's visit.   Last Pap No results found for: DIAGPAP  Rocky CHRISTELLA Ober, RN 12/18/2023  2:55 PM

## 2023-12-18 NOTE — Patient Instructions (Signed)
 [place] the emergency numbers and options for care are at the end of your after visit summary. The behavioral health urgent care is located at [address] My thoughts and prayers are with you.  Kind Regards, Rocky Ober, RN   [facility] [employer] has a partnership with the [employer] to provide prenatal navigation for the most needed resources in our community. In order to see how we can help connect you to these resources we need consent to contact you. Please complete the very short consent using the link below:   English Link: https://[place].tfaforms.net/283?site=16  Spanish Link: https://[place].tfaforms.net/287?site=16  Our practice his participating in a study that provides no-cost doula care. ACURE4Moms is a study looking at how doula care can reduce birthing disparities for Black and brown birthing people. We like to refer patients as soon as possible, but definitely before 28 weeks so patients can get to know their doula.    A doula is trained to provide support before, during and just after you give birth. While doulas do not provide medical care, they do provide emotional, physical and educational support. Doulas can help reduce your stress and comfort you and your partner. They can help you cope with labor by helping you use breathing techniques, massage, creative labor positioning, essential oils and affirmations.   ACURE4Moms is a research study trying to reduce:   low birthweight babies  emergency department visits & hospitalizations for birthing persons and their babies  depression among birthing people  discrimination in pregnancy-related care ACURE4Moms is trying out 2 programs designed by  people who have given birth. These programs include: 1. Sharing patient data and warning alerts with clinic staff to keep them accountable for their patients' outcomes and providing tools to help them  reduce bias in care. 2. Matching eligible  patients with doulas from the  same community as the patients.  If you would like to participate in this study, please visit:   http://carroll-castaneda.info/  Options for Doula Care in the [place] Area  As you review your birthing options, consider having a birth doula. A doula is trained to provide support before, during and just after you give birth. There are also postpartum doulas that help you adjust to new parenthood.  While doulas do not provide medical care, they do provide emotional, physical and educational support. A few months before your baby arrives, doulas can help answer questions, ease concerns and help you create and support your birthing plan.    Doulas can help reduce your stress and comfort you and your partner. They can help you cope with labor by helping you use breathing techniques, massage, creative labor positioning, essential oils and affirmations.   Studies show that the benefits of having a doula include:   A more positive birth experience  Fewer requests for pain-relief medication  Less likelihood of cesarean section, commonly called a c-section   Doulas are typically hired via a [employer] account planner between you and the doula. We are happy to provide a list of the most active doulas in the area, all of whom are credentialed by [facility] and will not count as a visitor at your birth.  There are several options for no-cost doula care at our hospital, including:  [facility] Volunteer Doula Program Every [employer] Program A Cure 4 Moms Doula Study (available only at [employer] for Women, Industry, [place] and [employer] [facility] offices)  For more information on these programs or to receive a list of  doulas active in our area, please email [email or web address]

## 2023-12-20 ENCOUNTER — Other Ambulatory Visit: Payer: Self-pay

## 2023-12-20 ENCOUNTER — Ambulatory Visit: Payer: Self-pay | Admitting: Obstetrics and Gynecology

## 2023-12-20 ENCOUNTER — Inpatient Hospital Stay (HOSPITAL_COMMUNITY)

## 2023-12-20 ENCOUNTER — Inpatient Hospital Stay (HOSPITAL_COMMUNITY)
Admission: AD | Admit: 2023-12-20 | Discharge: 2023-12-20 | Disposition: A | Discharge status: Home / Self Care | Attending: Obstetrics and Gynecology | Admitting: Obstetrics and Gynecology

## 2023-12-20 ENCOUNTER — Encounter (HOSPITAL_COMMUNITY): Payer: Self-pay | Admitting: Obstetrics and Gynecology

## 2023-12-20 DIAGNOSIS — N134 Hydroureter: Secondary | ICD-10-CM | POA: Diagnosis not present

## 2023-12-20 DIAGNOSIS — O99282 Endocrine, nutritional and metabolic diseases complicating pregnancy, second trimester: Secondary | ICD-10-CM | POA: Diagnosis not present

## 2023-12-20 DIAGNOSIS — Z743 Need for continuous supervision: Secondary | ICD-10-CM | POA: Diagnosis not present

## 2023-12-20 DIAGNOSIS — R1031 Right lower quadrant pain: Secondary | ICD-10-CM

## 2023-12-20 DIAGNOSIS — O2342 Unspecified infection of urinary tract in pregnancy, second trimester: Secondary | ICD-10-CM

## 2023-12-20 DIAGNOSIS — E876 Hypokalemia: Secondary | ICD-10-CM | POA: Diagnosis not present

## 2023-12-20 DIAGNOSIS — O26892 Other specified pregnancy related conditions, second trimester: Secondary | ICD-10-CM | POA: Diagnosis not present

## 2023-12-20 DIAGNOSIS — Z3A18 18 weeks gestation of pregnancy: Secondary | ICD-10-CM | POA: Diagnosis not present

## 2023-12-20 DIAGNOSIS — R109 Unspecified abdominal pain: Secondary | ICD-10-CM | POA: Diagnosis not present

## 2023-12-20 DIAGNOSIS — N1330 Unspecified hydronephrosis: Secondary | ICD-10-CM | POA: Diagnosis not present

## 2023-12-20 DIAGNOSIS — R1084 Generalized abdominal pain: Secondary | ICD-10-CM | POA: Diagnosis not present

## 2023-12-20 DIAGNOSIS — O26899 Other specified pregnancy related conditions, unspecified trimester: Secondary | ICD-10-CM

## 2023-12-20 LAB — CBC
HCT: 30.9 % — ABNORMAL LOW (ref 36.0–46.0)
Hemoglobin: 10.4 g/dL — ABNORMAL LOW (ref 12.0–15.0)
MCH: 29.1 pg (ref 26.0–34.0)
MCHC: 33.7 g/dL (ref 30.0–36.0)
MCV: 86.6 fL (ref 80.0–100.0)
Platelets: 201 K/uL (ref 150–400)
RBC: 3.57 MIL/uL — ABNORMAL LOW (ref 3.87–5.11)
RDW: 13.9 % (ref 11.5–15.5)
WBC: 9.5 K/uL (ref 4.0–10.5)
nRBC: 0 % (ref 0.0–0.2)

## 2023-12-20 LAB — CBC/D/PLT+RPR+RH+ABO+RUBIGG...
Antibody Screen: NEGATIVE
Basophils Absolute: 0 x10E3/uL (ref 0.0–0.2)
Basos: 0 %
EOS (ABSOLUTE): 0.1 x10E3/uL (ref 0.0–0.4)
Eos: 1 %
HCV Ab: NONREACTIVE
HIV Screen 4th Generation wRfx: NONREACTIVE
Hematocrit: 32.6 % — ABNORMAL LOW (ref 34.0–46.6)
Hemoglobin: 10.9 g/dL — ABNORMAL LOW (ref 11.1–15.9)
Hepatitis B Surface Ag: NEGATIVE
Immature Grans (Abs): 0 x10E3/uL (ref 0.0–0.1)
Immature Granulocytes: 0 %
Lymphocytes Absolute: 1.6 x10E3/uL (ref 0.7–3.1)
Lymphs: 17 %
MCH: 29.9 pg (ref 26.6–33.0)
MCHC: 33.4 g/dL (ref 31.5–35.7)
MCV: 89 fL (ref 79–97)
Monocytes Absolute: 0.6 x10E3/uL (ref 0.1–0.9)
Monocytes: 7 %
Neutrophils Absolute: 7 x10E3/uL (ref 1.4–7.0)
Neutrophils: 75 %
Platelets: 233 x10E3/uL (ref 150–450)
RBC: 3.65 x10E6/uL — ABNORMAL LOW (ref 3.77–5.28)
RDW: 13.6 % (ref 11.7–15.4)
RPR Ser Ql: NONREACTIVE
Rh Factor: POSITIVE
Rubella Antibodies, IGG: 6.98 {index} (ref >0.99)
WBC: 9.4 x10E3/uL (ref 3.4–10.8)

## 2023-12-20 LAB — URINALYSIS, ROUTINE W REFLEX MICROSCOPIC
Bilirubin Urine: NEGATIVE
Glucose, UA: NEGATIVE mg/dL
Hgb urine dipstick: NEGATIVE
Ketones, ur: 20 mg/dL — AB
Leukocytes,Ua: NEGATIVE
Nitrite: NEGATIVE
Protein, ur: NEGATIVE mg/dL
Specific Gravity, Urine: 1.006 (ref 1.005–1.030)
pH: 8 (ref 5.0–8.0)

## 2023-12-20 LAB — COMPREHENSIVE METABOLIC PANEL WITH GFR
ALT: 9 U/L (ref 0–44)
AST: 15 U/L (ref 15–41)
Albumin: 2.8 g/dL — ABNORMAL LOW (ref 3.5–5.0)
Alkaline Phosphatase: 43 U/L (ref 38–126)
Anion gap: 10 (ref 5–15)
BUN: <5 mg/dL — ABNORMAL LOW (ref 6–20)
CO2: 19 mmol/L — ABNORMAL LOW (ref 22–32)
Calcium: 8.9 mg/dL (ref 8.9–10.3)
Chloride: 107 mmol/L (ref 98–111)
Creatinine, Ser: 0.58 mg/dL (ref 0.44–1.00)
GFR, Estimated: >60 mL/min (ref >60)
Glucose, Bld: 86 mg/dL (ref 70–99)
Potassium: 3.2 mmol/L — ABNORMAL LOW (ref 3.5–5.1)
Sodium: 136 mmol/L (ref 135–145)
Total Bilirubin: 0.5 mg/dL (ref 0.0–1.2)
Total Protein: 6.5 g/dL (ref 6.5–8.1)

## 2023-12-20 LAB — HCV INTERPRETATION

## 2023-12-20 LAB — HEMOGLOBIN A1C
Est. average glucose Bld gHb Est-mCnc: 103 mg/dL
Hgb A1c MFr Bld: 5.2 % (ref 4.8–5.6)

## 2023-12-20 MED ORDER — POLYETHYLENE GLYCOL 3350 17 G PO PACK
17.0000 g | PACK | Freq: Every day | ORAL | Status: DC
  Administered 2023-12-20: 17 g via ORAL
  Filled 2023-12-20: qty 1

## 2023-12-20 MED ORDER — POTASSIUM CHLORIDE CRYS ER 20 MEQ PO TBCR
40.0000 meq | EXTENDED_RELEASE_TABLET | Freq: Once | ORAL | Status: AC
  Administered 2023-12-20: 40 meq via ORAL
  Filled 2023-12-20: qty 2

## 2023-12-20 MED ORDER — POLYETHYLENE GLYCOL 3350 17 G PO PACK: 17.0000 g | PACK | Freq: Every day | ORAL | 0 refills | Status: DC

## 2023-12-20 NOTE — MAU Note (Signed)
 Catherine Weber is a 33 y.o. at [redacted]w[redacted]d here in MAU reporting: pt arrived by EMS from maternity home. Reporting RLQ, started 2 night ago getting worse.  Last BM was same day.  Denies any bleeding or vag d/c.  Hasn't really had an appetite.  Ems reported VSS. CBG 77.

## 2023-12-20 NOTE — MAU Note (Signed)
 Catherine Weber is a 33 y.o. at [redacted]w[redacted]d here in MAU via EMS reporting: she's having RLQ pain that began a few days ago.  Reports pain was initially an ache but now it's a constant cramping ache that worsens with deep breaths.  Took Tylenol  last night @ 1800, no relief noted. Denies VB or LOF.  LMP: NA Onset of complaint: 3 days ago Pain score: 6 Vitals:   12/20/23 0939  BP: (!) 101/58  Pulse: 72  Resp: 19  Temp: 98 F (36.7 C)  SpO2: 100%     FHT: 139 bpm  Lab orders placed from triage: UA
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
## 2023-12-20 NOTE — Discharge Instructions (Signed)
 It was a pleasure taking care of you tonight.  I think your pain is likely related to constipation.  I have sent a prescription for MiraLAX  to your pharmacy and would like you to take it at least daily and you can increase it to twice daily if you are not having regular soft bowel movements daily.  If you have any worsening pain or issues please return for further evaluation.  I hope you have a great rest of your night!

## 2023-12-21 DIAGNOSIS — F99 Mental disorder, not otherwise specified: Secondary | ICD-10-CM | POA: Diagnosis not present

## 2023-12-23 LAB — CULTURE, OB URINE

## 2023-12-23 LAB — URINE CULTURE, OB REFLEX

## 2023-12-24 MED ORDER — NITROFURANTOIN MONOHYD MACRO 100 MG PO CAPS: 100.0000 mg | ORAL_CAPSULE | Freq: Two times a day (BID) | ORAL | 0 refills | Status: DC

## 2023-12-25 ENCOUNTER — Telehealth: Payer: Self-pay | Admitting: *Deleted

## 2023-12-25 NOTE — Telephone Encounter (Signed)
 Pt made aware of +UTI and Rx sent in by provider.  Pt advised she should have another UC in pregnancy for TOC.

## 2023-12-28 ENCOUNTER — Other Ambulatory Visit: Payer: Self-pay

## 2023-12-28 DIAGNOSIS — O2342 Unspecified infection of urinary tract in pregnancy, second trimester: Secondary | ICD-10-CM

## 2023-12-28 MED ORDER — NITROFURANTOIN MONOHYD MACRO 100 MG PO CAPS: 100.0000 mg | ORAL_CAPSULE | Freq: Two times a day (BID) | ORAL | 0 refills | Status: DC

## 2024-01-03 ENCOUNTER — Encounter: Admitting: Obstetrics and Gynecology

## 2024-01-10 ENCOUNTER — Encounter: Payer: Self-pay | Admitting: Obstetrics and Gynecology

## 2024-01-10 ENCOUNTER — Ambulatory Visit: Admitting: Obstetrics and Gynecology

## 2024-01-10 ENCOUNTER — Other Ambulatory Visit (HOSPITAL_COMMUNITY)
Admission: RE | Admit: 2024-01-10 | Discharge: 2024-01-10 | Disposition: A | Discharge status: Home / Self Care | Source: Ambulatory Visit | Attending: Obstetrics and Gynecology | Admitting: Obstetrics and Gynecology

## 2024-01-10 VITALS — BP 97/62 | HR 71 | Wt 149.8 lb

## 2024-01-10 DIAGNOSIS — Z3480 Encounter for supervision of other normal pregnancy, unspecified trimester: Secondary | ICD-10-CM | POA: Diagnosis not present

## 2024-01-10 DIAGNOSIS — N390 Urinary tract infection, site not specified: Secondary | ICD-10-CM

## 2024-01-10 DIAGNOSIS — N898 Other specified noninflammatory disorders of vagina: Secondary | ICD-10-CM | POA: Diagnosis not present

## 2024-01-10 DIAGNOSIS — Z3A21 21 weeks gestation of pregnancy: Secondary | ICD-10-CM | POA: Diagnosis not present

## 2024-01-10 DIAGNOSIS — B952 Enterococcus as the cause of diseases classified elsewhere: Secondary | ICD-10-CM | POA: Diagnosis not present

## 2024-01-10 MED ORDER — ASPIRIN 81 MG PO TBEC: 81.0000 mg | DELAYED_RELEASE_TABLET | Freq: Every day | ORAL | 12 refills | Status: DC

## 2024-01-10 MED ORDER — VITAFOL-ONE 29-1-200 MG PO CAPS
1.0000 | ORAL_CAPSULE | Freq: Every day | ORAL | 11 refills | Status: DC
Start: 2024-01-10 — End: 2024-01-11
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
## 2024-01-10 NOTE — Progress Notes (Signed)
 Pt presents for NOB visit.  Pt is late to care.  Requesting new PNV.  Last PAP approx. 3 years ago

## 2024-01-11 ENCOUNTER — Other Ambulatory Visit: Payer: Self-pay

## 2024-01-11 ENCOUNTER — Other Ambulatory Visit: Payer: Self-pay | Admitting: Obstetrics and Gynecology

## 2024-01-11 MED ORDER — VITAFOL FE+ 90-0.6-0.4-200 MG PO CAPS: 1.0000 | ORAL_CAPSULE | Freq: Every day | ORAL | 12 refills | Status: DC

## 2024-01-11 MED ORDER — FERROUS SULFATE 325 (65 FE) MG PO TABS: 325.0000 mg | ORAL_TABLET | Freq: Every day | ORAL | 1 refills | Status: DC

## 2024-01-14 LAB — CYTOLOGY - PAP
Comment: NEGATIVE
Diagnosis: NEGATIVE
High risk HPV: NEGATIVE

## 2024-01-14 LAB — CERVICOVAGINAL ANCILLARY ONLY
Bacterial Vaginitis (gardnerella): POSITIVE — AB
Candida Glabrata: NEGATIVE
Candida Vaginitis: NEGATIVE
Chlamydia: NEGATIVE
Comment: NEGATIVE
Comment: NEGATIVE
Comment: NEGATIVE
Comment: NEGATIVE
Comment: NEGATIVE
Comment: NORMAL
Neisseria Gonorrhea: NEGATIVE
Trichomonas: NEGATIVE

## 2024-01-15 ENCOUNTER — Ambulatory Visit: Payer: Self-pay | Admitting: Obstetrics and Gynecology

## 2024-01-15 ENCOUNTER — Other Ambulatory Visit: Payer: Self-pay

## 2024-01-15 DIAGNOSIS — M9903 Segmental and somatic dysfunction of lumbar region: Secondary | ICD-10-CM | POA: Diagnosis not present

## 2024-01-15 DIAGNOSIS — M9905 Segmental and somatic dysfunction of pelvic region: Secondary | ICD-10-CM | POA: Diagnosis not present

## 2024-01-15 DIAGNOSIS — M9904 Segmental and somatic dysfunction of sacral region: Secondary | ICD-10-CM | POA: Diagnosis not present

## 2024-01-15 DIAGNOSIS — M62830 Muscle spasm of back: Secondary | ICD-10-CM | POA: Diagnosis not present

## 2024-01-15 MED ORDER — VITAFOL FE+ 90-0.6-0.4-200 MG PO CAPS: 1.0000 | ORAL_CAPSULE | Freq: Every day | ORAL | 12 refills | Status: DC

## 2024-01-15 MED ORDER — CLINDAMYCIN HCL 300 MG PO CAPS: 300.0000 mg | ORAL_CAPSULE | Freq: Two times a day (BID) | ORAL | 0 refills | Status: AC

## 2024-01-15 NOTE — Progress Notes (Signed)
 Returned call, pt states pharmacy doe not have PNV in stock. Requested rx be sent to [facility]

## 2024-01-17 ENCOUNTER — Telehealth: Payer: Self-pay | Admitting: Podiatry

## 2024-01-17 NOTE — Telephone Encounter (Signed)
 Pt called and left message on voicemail to reschedule surgery she cancelled in January. I called patient and got her scheduled to come in and discuss surgery again and sign updated consents to get patient scheduled.

## 2024-01-23 DIAGNOSIS — M9905 Segmental and somatic dysfunction of pelvic region: Secondary | ICD-10-CM | POA: Diagnosis not present

## 2024-01-23 DIAGNOSIS — M9904 Segmental and somatic dysfunction of sacral region: Secondary | ICD-10-CM | POA: Diagnosis not present

## 2024-01-23 DIAGNOSIS — M9903 Segmental and somatic dysfunction of lumbar region: Secondary | ICD-10-CM | POA: Diagnosis not present

## 2024-01-23 DIAGNOSIS — M62830 Muscle spasm of back: Secondary | ICD-10-CM | POA: Diagnosis not present

## 2024-01-25 ENCOUNTER — Ambulatory Visit (HOSPITAL_BASED_OUTPATIENT_CLINIC_OR_DEPARTMENT_OTHER)

## 2024-01-25 ENCOUNTER — Ambulatory Visit: Attending: Obstetrics and Gynecology | Admitting: Obstetrics and Gynecology

## 2024-01-25 VITALS — BP 101/55 | HR 77

## 2024-01-25 DIAGNOSIS — Z363 Encounter for antenatal screening for malformations: Secondary | ICD-10-CM | POA: Diagnosis not present

## 2024-01-25 DIAGNOSIS — Z3A22 22 weeks gestation of pregnancy: Secondary | ICD-10-CM | POA: Diagnosis not present

## 2024-01-25 DIAGNOSIS — Z3482 Encounter for supervision of other normal pregnancy, second trimester: Secondary | ICD-10-CM | POA: Diagnosis not present

## 2024-01-25 DIAGNOSIS — O26842 Uterine size-date discrepancy, second trimester: Secondary | ICD-10-CM

## 2024-01-25 DIAGNOSIS — Z3492 Encounter for supervision of normal pregnancy, unspecified, second trimester: Secondary | ICD-10-CM

## 2024-01-25 DIAGNOSIS — Z3480 Encounter for supervision of other normal pregnancy, unspecified trimester: Secondary | ICD-10-CM | POA: Diagnosis not present

## 2024-01-25 NOTE — Progress Notes (Signed)
 Maternal-Fetal Medicine Consultation  Name: Catherine Weber  MRN: [id number]  GA: [id number] [redacted]w[redacted]d   Patient is here for fetal anatomy scan. Patient had opted not to screen for fetal aneuploidies.  Ultrasound Fetal biometry is consistent with her previously-established dates. Normal amniotic fluid.  No makers of aneuploidies or fetal structural defects are seen.  Patient understands the limitations of ultrasound in detecting fetal anomalies.   Pregnancy dating Patient had 8-week ultrasound that differed from LMP-established date by more than 6 days. I counseled the patient that her EDD should be based on 8-week ultrasound. We have assigned her EDD at 05/24/24.   Recommendations -Follow-up scans as clinically indicated.     Consultation including face-to-face (more than 50%) counseling 15 minutes.

## 2024-01-30 DIAGNOSIS — M9903 Segmental and somatic dysfunction of lumbar region: Secondary | ICD-10-CM | POA: Diagnosis not present

## 2024-01-30 DIAGNOSIS — M9905 Segmental and somatic dysfunction of pelvic region: Secondary | ICD-10-CM | POA: Diagnosis not present

## 2024-01-30 DIAGNOSIS — M9904 Segmental and somatic dysfunction of sacral region: Secondary | ICD-10-CM | POA: Diagnosis not present

## 2024-01-30 DIAGNOSIS — M62830 Muscle spasm of back: Secondary | ICD-10-CM | POA: Diagnosis not present

## 2024-02-07 ENCOUNTER — Ambulatory Visit: Admitting: Obstetrics and Gynecology

## 2024-02-07 VITALS — BP 104/67 | HR 84 | Wt 166.0 lb

## 2024-02-07 DIAGNOSIS — Z3480 Encounter for supervision of other normal pregnancy, unspecified trimester: Secondary | ICD-10-CM

## 2024-02-07 DIAGNOSIS — M62830 Muscle spasm of back: Secondary | ICD-10-CM | POA: Diagnosis not present

## 2024-02-07 DIAGNOSIS — Z3A24 24 weeks gestation of pregnancy: Secondary | ICD-10-CM | POA: Diagnosis not present

## 2024-02-07 DIAGNOSIS — M9904 Segmental and somatic dysfunction of sacral region: Secondary | ICD-10-CM | POA: Diagnosis not present

## 2024-02-07 DIAGNOSIS — M9905 Segmental and somatic dysfunction of pelvic region: Secondary | ICD-10-CM | POA: Diagnosis not present

## 2024-02-07 DIAGNOSIS — M9903 Segmental and somatic dysfunction of lumbar region: Secondary | ICD-10-CM | POA: Diagnosis not present

## 2024-02-07 DIAGNOSIS — O2340 Unspecified infection of urinary tract in pregnancy, unspecified trimester: Secondary | ICD-10-CM | POA: Insufficient documentation

## 2024-02-07 MED ORDER — VITAFOL FE+ 90-0.6-0.4-200 MG PO CAPS: 1.0000 | ORAL_CAPSULE | Freq: Every day | ORAL | 12 refills | Status: DC

## 2024-02-07 MED ORDER — FERROUS SULFATE 325 (65 FE) MG PO TABS: 325.0000 mg | ORAL_TABLET | Freq: Every day | ORAL | 1 refills | Status: AC

## 2024-02-07 MED ORDER — ASPIRIN 81 MG PO TBEC: 81.0000 mg | DELAYED_RELEASE_TABLET | Freq: Every day | ORAL | 12 refills | Status: AC

## 2024-02-07 NOTE — Progress Notes (Signed)
   PRENATAL VISIT NOTE  Subjective:  Catherine Weber is a 33 y.o. G2P0010 at [redacted]w[redacted]d being seen today for ongoing prenatal care.  She is currently monitored for the following issues for this low-risk pregnancy and has Bilateral occipital neuralgia; Chronic pain syndrome; Displacement of cervical intervertebral disc; Displacement of lumbar intervertebral disc without myelopathy; History of migraine; Supervision of other normal pregnancy, antepartum; and UTI in pregnancy, antepartum on their problem list.  Patient reports no complaints.  Contractions: Not present. Vag. Bleeding: None.  Movement: Present. Denies leaking of fluid.   The following portions of the patient's history were reviewed and updated as appropriate: allergies, current medications, past family history, past medical history, past social history, past surgical history and problem list.   Objective:   Vitals:   02/07/24 1600  BP: 104/67  Pulse: 84  Weight: 166 lb (75.3 kg)   Body mass index is 27.84 kg/m. Total weight gain: Not found.   Fetal Status: Fetal Heart Rate (bpm): 140 Fundal Height: 24 cm Movement: Present     General:  Alert, oriented and cooperative. Patient is in no acute distress.  Skin: Skin is warm and dry. No rash noted.   Cardiovascular: Normal heart rate noted  Respiratory: Normal respiratory effort, no problems with respiration noted  Abdomen: Soft, gravid, appropriate for gestational age.  Pain/Pressure: Absent     Pelvic: Cervical exam deferred        Extremities: Normal range of motion.     Mental Status: Normal mood and affect. Normal behavior. Normal judgment and thought content.   Assessment and Plan:  Pregnancy: G2P0010 at [redacted]w[redacted]d 1. Supervision of other normal pregnancy, antepartum (Primary) Anticipatory guidance, refills given, reviewed weight gain Declines flu shot - Prenat-Fe Poly-Methfol-FA-DHA (VITAFOL  FE+) 90-0.6-0.4-200 MG CAPS; Take 1 capsule by mouth daily.  Dispense: 30 capsule;  Refill: 12 - ferrous sulfate 325 (65 FE) MG tablet; Take 1 tablet (325 mg total) by mouth daily with breakfast.  Dispense: 90 tablet; Refill: 1 - aspirin EC 81 MG tablet; Take 1 tablet (81 mg total) by mouth daily. Swallow whole.  Dispense: 30 tablet; Refill: 12  2. UTI in pregnancy, antepartum Test of cure - Urine Culture  Preterm labor symptoms and general obstetric precautions including but not limited to vaginal bleeding, contractions, leaking of fluid and fetal movement were reviewed in detail with the patient. Please refer to After Visit Summary for other counseling recommendations.   Return in about 4 weeks (around 03/06/2024) for GTT & labs.  Future Appointments  Date Time Provider Department Center  02/12/2024 11:15 AM Silva Juliene SAUNDERS, DPM [facility]    Rollo ONEIDA Bring, MD

## 2024-02-10 ENCOUNTER — Ambulatory Visit: Payer: Self-pay | Admitting: Obstetrics and Gynecology

## 2024-02-10 LAB — URINE CULTURE: Organism ID, Bacteria: NO GROWTH

## 2024-02-12 ENCOUNTER — Institutional Professional Consult (permissible substitution) (INDEPENDENT_AMBULATORY_CARE_PROVIDER_SITE_OTHER): Admitting: Podiatry

## 2024-02-12 VITALS — Ht 64.75 in | Wt 166.0 lb

## 2024-02-12 DIAGNOSIS — D1632 Benign neoplasm of short bones of left lower limb: Secondary | ICD-10-CM | POA: Diagnosis not present
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
## 2024-02-25 ENCOUNTER — Other Ambulatory Visit: Payer: Self-pay

## 2024-02-25 MED ORDER — PRENATAL 28-0.8 MG PO TABS: 1.0000 | ORAL_TABLET | Freq: Every day | ORAL | 12 refills | Status: AC

## 2024-03-05 ENCOUNTER — Ambulatory Visit: Payer: Self-pay | Admitting: Family Medicine

## 2024-03-05 ENCOUNTER — Encounter: Payer: Self-pay | Admitting: Family Medicine

## 2024-03-05 ENCOUNTER — Other Ambulatory Visit

## 2024-03-05 VITALS — BP 100/63 | HR 80 | Wt 172.0 lb

## 2024-03-05 DIAGNOSIS — Z3A28 28 weeks gestation of pregnancy: Secondary | ICD-10-CM

## 2024-03-05 DIAGNOSIS — Z3480 Encounter for supervision of other normal pregnancy, unspecified trimester: Secondary | ICD-10-CM | POA: Diagnosis not present

## 2024-03-05 NOTE — Patient Instructions (Signed)
 TDaP Vaccine Pregnancy Get the Whooping Cough Vaccine While You Are Pregnant (CDC)  It is important for women to get the whooping cough vaccine in the third trimester of each pregnancy. Vaccines are the best way to prevent this disease. There are 2 different whooping cough vaccines. Both vaccines combine protection against whooping cough, tetanus and diphtheria, but they are for different age groups: Tdap: for everyone 11 years or older, including pregnant women  DTaP: for children 2 months through 96 years of age  You need the whooping cough vaccine during each of your pregnancies The recommended time to get the shot is during your 27th through 36th week of pregnancy, preferably during the earlier part of this time period. The Centers for Disease Control and Prevention (CDC) recommends that pregnant women receive the whooping cough vaccine for adolescents and adults (called Tdap vaccine) during the third trimester of each pregnancy. The recommended time to get the shot is during your 27th through 36th week of pregnancy, preferably during the earlier part of this time period. This replaces the original recommendation that pregnant women get the vaccine only if they had not previously received it. The [employer] of Obstetricians and Gynecologists and the [employer] support this recommendation.  You should get the whooping cough vaccine while pregnant to pass protection to your baby After receiving the whooping cough vaccine, your body will create protective antibodies (proteins produced by the body to fight off diseases) and pass some of them to your baby before birth. These antibodies provide your baby some short-term protection against whooping cough in early life. These antibodies can also protect your baby from some of the more serious complications that come along with whooping cough. Your protective antibodies are at their highest about 2 weeks after getting the  vaccine, but it takes time to pass them to your baby. So the preferred time to get the whooping cough vaccine is early in your third trimester. The amount of whooping cough antibodies in your body decreases over time. That is why CDC recommends you get a whooping cough vaccine during each pregnancy. Doing so allows each of your babies to get the greatest number of protective antibodies from you. This means each of your babies will get the best protection possible against this disease.  Getting the whooping cough vaccine while pregnant is better than getting the vaccine after you give birth Whooping cough vaccination during pregnancy is ideal so your baby will have short-term protection as soon as he is born. This early protection is important because your baby will not start getting his whooping cough vaccines until he is 2 months old. These first few months of life are when your baby is at greatest risk for catching whooping cough. This is also when he's at greatest risk for having severe, potentially life-threating complications from the infection. To avoid that gap in protection, it is best to get a whooping cough vaccine during pregnancy. You will then pass protection to your baby before he is born. To continue protecting your baby, he should get whooping cough vaccines starting at 2 months old. You may never have gotten the Tdap vaccine before and did not get it during this pregnancy. If so, you should make sure to get the vaccine immediately after you give birth, before leaving the hospital or birthing center. It will take about 2 weeks before your body develops protection (antibodies) in response to the vaccine. Once you have protection from the vaccine, you are less  likely to give whooping cough to your newborn while caring for him. But remember, your baby will still be at risk for catching whooping cough from others. A recent study looked to see how effective Tdap was at preventing whooping cough in  babies whose mothers got the vaccine while pregnant or in the hospital after giving birth. The study found that getting Tdap between 27 through 36 weeks of pregnancy is 85% more effective at preventing whooping cough in babies younger than 2 months old. Blood tests cannot tell if you need a whooping cough vaccine There are no blood tests that can tell you if you have enough antibodies in your body to protect yourself or your baby against whooping cough. Even if you have been sick with whooping cough in the past or previously received the vaccine, you still should get the vaccine during each pregnancy. Breastfeeding may pass some protective antibodies onto your baby By breastfeeding, you may pass some antibodies you have made in response to the vaccine to your baby. When you get a whooping cough vaccine during your pregnancy, you will have antibodies in your breast milk that you can share with your baby as soon as your milk comes in. However, your baby will not get protective antibodies immediately if you wait to get the whooping cough vaccine until after delivering your baby. This is because it takes about 2 weeks for your body to create antibodies. Learn more about the health benefits of breastfeeding.

## 2024-03-05 NOTE — Progress Notes (Signed)
 PRENATAL VISIT NOTE  Subjective:  Catherine Weber is a 33 y.o. G2P0010 at [redacted]w[redacted]d being seen today for ongoing prenatal care.  She is currently monitored for the following issues for this low-risk pregnancy and has Bilateral occipital neuralgia; Chronic pain syndrome; Displacement of cervical intervertebral disc; Displacement of lumbar intervertebral disc without myelopathy; History of migraine; Supervision of other normal pregnancy, antepartum; and UTI in pregnancy, antepartum on their problem list.  Patient reports no complaints.  Contractions: Not present. Vag. Bleeding: None.  Movement: Present. Denies leaking of fluid.   The following portions of the patient's history were reviewed and updated as appropriate: allergies, current medications, past family history, past medical history, past social history, past surgical history and problem list.   Objective:   Vitals:   03/05/24 1040  BP: 100/63  Pulse: 80  Weight: 172 lb (78 kg)    Fetal Status:      Movement: Present    General: Alert, oriented and cooperative. Patient is in no acute distress.  Skin: Skin is warm and dry. No rash noted.   Cardiovascular: Normal heart rate noted  Respiratory: Normal respiratory effort, no problems with respiration noted  Abdomen: Soft, gravid, appropriate for gestational age.  Pain/Pressure: Absent     Pelvic: Cervical exam deferred        Extremities: Normal range of motion.  Edema: None  Mental Status: Normal mood and affect. Normal behavior. Normal judgment and thought content.      03/05/2024   10:45 AM 01/10/2024    4:38 PM 12/18/2023    3:24 PM  Depression screen PHQ 2/9  Decreased Interest 0 0 0  Down, Depressed, Hopeless 0 0 0  PHQ - 2 Score 0 0 0  Altered sleeping 2 2 3   Tired, decreased energy 2 1 3   Change in appetite 0 0 0  Feeling bad or failure about yourself  0 0 0  Trouble concentrating 0 0 0  Moving slowly or fidgety/restless 0 0 0  Suicidal thoughts 0 0 0  PHQ-9 Score 4  3  6       Data saved with a previous flowsheet row definition        03/05/2024   10:46 AM 01/10/2024    4:39 PM 12/18/2023    3:27 PM  GAD 7 : Generalized Anxiety Score  Nervous, Anxious, on Edge 0 1 1  Control/stop worrying 0 0 0  Worry too much - different things 0 0 0  Trouble relaxing 2 1 0  Restless 0 0 0  Easily annoyed or irritable 0 1 0  Afraid - awful might happen 0 0 0  Total GAD 7 Score 2 3 1     Assessment and Plan:  Pregnancy: G2P0010 at [redacted]w[redacted]d 1. Supervision of other normal pregnancy, antepartum (Primary) Prenatal course reviewed BP, HR, FHR within normal limits Feeling regular FM  Pediatrician:  Patient is interested in waterbirth, schedule next visit with CNM  2. [redacted] weeks gestation of pregnancy 28 week labs and GTT today Discussed recommendation for Tdap during every pregnancy. Patient would like to wait until her next visit for this.  Preterm labor symptoms and general obstetric precautions including but not limited to vaginal bleeding, contractions, leaking of fluid and fetal movement were reviewed in detail with the patient. Please refer to After Visit Summary for other counseling recommendations.   Return in about 2 weeks (around 03/19/2024) for LOB with CNM.  Future Appointments  Date Time Provider Department Center  06/24/2024  8:15  AM Silva Juliene SAUNDERS, DPM [facility]    Joesph DELENA Sear, [place]

## 2024-03-05 NOTE — Progress Notes (Signed)
 Pt presents for ROB visit. Unable to fill PNV due to insurance. Need Rx due to housing requirements. Declines Tdap and flu vaccines today

## 2024-03-06 LAB — CBC
Hematocrit: 30.4 % — ABNORMAL LOW (ref 34.0–46.6)
Hemoglobin: 9.8 g/dL — ABNORMAL LOW (ref 11.1–15.9)
MCH: 29.5 pg (ref 26.6–33.0)
MCHC: 32.2 g/dL (ref 31.5–35.7)
MCV: 92 fL (ref 79–97)
Platelets: 214 x10E3/uL (ref 150–450)
RBC: 3.32 x10E6/uL — ABNORMAL LOW (ref 3.77–5.28)
RDW: 12.5 % (ref 11.7–15.4)
WBC: 6.6 x10E3/uL (ref 3.4–10.8)

## 2024-03-06 LAB — HIV ANTIBODY (ROUTINE TESTING W REFLEX): HIV Screen 4th Generation wRfx: NONREACTIVE

## 2024-03-06 LAB — GLUCOSE TOLERANCE, 2 HOURS W/ 1HR
Glucose, 1 hour: 110 mg/dL (ref 70–179)
Glucose, 2 hour: 87 mg/dL (ref 70–152)
Glucose, Fasting: 81 mg/dL (ref 70–91)

## 2024-03-06 LAB — SYPHILIS: RPR W/REFLEX TO RPR TITER AND TREPONEMAL ANTIBODIES, TRADITIONAL SCREENING AND DIAGNOSIS ALGORITHM: RPR Ser Ql: NONREACTIVE

## 2024-03-07 ENCOUNTER — Ambulatory Visit: Payer: Self-pay | Admitting: Family Medicine

## 2024-03-07 DIAGNOSIS — O99013 Anemia complicating pregnancy, third trimester: Secondary | ICD-10-CM | POA: Insufficient documentation

## 2024-03-10 ENCOUNTER — Telehealth (HOSPITAL_COMMUNITY): Payer: Self-pay | Admitting: Family Medicine

## 2024-03-10 ENCOUNTER — Telehealth: Payer: Self-pay

## 2024-03-10 NOTE — Telephone Encounter (Signed)
 Auth Submission: NO AUTH NEEDED Site of care: Site of care: [facility] Payer: [employer] commercial Medication & CPT/J Code(s) submitted: Venofer (Iron Sucrose) J1756 Diagnosis Code:  Route of submission (phone, fax, portal):  Phone # Fax # Auth type: Buy/Bill PB Units/visits requested: 300mg  x 3 doses Reference number:  Approval from: 03/10/24 to 04/09/24

## 2024-03-10 NOTE — Telephone Encounter (Signed)
 Patient referred to infusion pharmacy team for ambulatory infusion of IV iron.  Insurance - [employer] of care - Site of care: [facility] Dx code - O99.013 IV Iron Therapy - Venofer 300 mg IV x 3  Infusion appointments - Scheduling team will schedule patient as soon as possible.   Catherine Weber D. Catherine Weber, PharmD

## 2024-03-18 ENCOUNTER — Encounter: Admitting: Advanced Practice Midwife

## 2024-03-31 ENCOUNTER — Encounter: Admitting: Obstetrics

## 2024-03-31 ENCOUNTER — Encounter: Payer: Self-pay | Admitting: Obstetrics and Gynecology

## 2024-03-31 ENCOUNTER — Ambulatory Visit: Admitting: Obstetrics and Gynecology

## 2024-03-31 VITALS — BP 113/73 | HR 87 | Wt 177.6 lb

## 2024-03-31 DIAGNOSIS — O99013 Anemia complicating pregnancy, third trimester: Secondary | ICD-10-CM | POA: Diagnosis not present

## 2024-03-31 DIAGNOSIS — Z3A32 32 weeks gestation of pregnancy: Secondary | ICD-10-CM | POA: Diagnosis not present

## 2024-03-31 DIAGNOSIS — Z3480 Encounter for supervision of other normal pregnancy, unspecified trimester: Secondary | ICD-10-CM

## 2024-03-31 NOTE — Patient Instructions (Signed)

## 2024-03-31 NOTE — Progress Notes (Signed)
 Pt presents for ROB visit. C/o lower abd pressure.

## 2024-03-31 NOTE — Progress Notes (Signed)
" ° °  PRENATAL VISIT NOTE  Subjective:  Catherine Weber is a 33 y.o. G2P0010 at [redacted]w[redacted]d being seen today for ongoing prenatal care.  She is currently monitored for the following issues for this low-risk pregnancy and has Bilateral occipital neuralgia; Chronic pain syndrome; Displacement of cervical intervertebral disc; Displacement of lumbar intervertebral disc without myelopathy; History of migraine; Supervision of other normal pregnancy, antepartum; UTI in pregnancy, antepartum; and Anemia affecting pregnancy in third trimester on their problem list.  Patient reports intermittent lower abd pressure, on feet all day . 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 Vag. Bleeding: None.  Movement: Present. Denies leaking of fluid.   The following portions of the patient's history were reviewed and updated as appropriate: allergies, current medications, past family history, past medical history, past social history, past surgical history and problem list.   Objective:   Vitals:   03/31/24 1623  BP: 113/73  Pulse: 87  Weight: 177 lb 9.6 oz (80.6 kg)    Fetal Status:  Fetal Heart Rate (bpm): 139 Fundal Height: 34 cm Movement: Present    General: Alert, oriented and cooperative. Patient is in no acute distress.  Skin: Skin is warm and dry. No rash noted.   Cardiovascular: Normal heart rate noted  Respiratory: Normal respiratory effort, no problems with respiration noted  Abdomen: Soft, gravid, appropriate for gestational age.  Pain/Pressure: Present     Pelvic: Cervical exam deferred        Extremities: Normal range of motion.  Edema: Trace  Mental Status: Normal mood and affect. Normal behavior. Normal judgment and thought content.   Assessment and Plan:  Pregnancy: G2P0010 at [redacted]w[redacted]d 1. Supervision of other normal pregnancy, antepartum (Primary) BP and FHR normal Doing well, feeling regular movement   2. [redacted] weeks gestation of pregnancy Encouraged maternity  support belt Accommodation letter sent to allow 10/15 min break every two hours  She completed waterbirth class, encouraged to send certificate, will plan to schedule with CNM Peds list given  Precautions given for travel during pregnancy, in case of emergency nearest hospital, compression socks, hydration  3. Anemia affecting pregnancy in third trimester Unable to complete infusions d/t schedule, continue oral iron at this time    Preterm labor symptoms and general obstetric precautions including but not limited to vaginal bleeding, contractions, leaking of fluid and fetal movement were reviewed in detail with the patient. Please refer to After Visit Summary for other counseling recommendations.    Future Appointments  Date Time Provider Department Center  04/14/2024  1:50 PM Davis, Devon E, PA-C [facility] None  06/24/2024  8:15 AM McDonald, Juliene SAUNDERS, DPM [facility]    Nidia Daring, FNP  "

## 2024-04-13 ENCOUNTER — Encounter: Payer: Self-pay | Admitting: Obstetrics and Gynecology

## 2024-04-13 ENCOUNTER — Encounter (HOSPITAL_COMMUNITY): Payer: Self-pay | Admitting: Obstetrics and Gynecology

## 2024-04-13 ENCOUNTER — Other Ambulatory Visit: Payer: Self-pay

## 2024-04-13 ENCOUNTER — Inpatient Hospital Stay (HOSPITAL_COMMUNITY)
Admission: AD | Admit: 2024-04-13 | Discharge: 2024-04-13 | Disposition: A | Discharge status: Home / Self Care | Attending: Obstetrics and Gynecology | Admitting: Obstetrics and Gynecology

## 2024-04-13 DIAGNOSIS — M79642 Pain in left hand: Secondary | ICD-10-CM | POA: Insufficient documentation

## 2024-04-13 DIAGNOSIS — R609 Edema, unspecified: Secondary | ICD-10-CM | POA: Diagnosis not present

## 2024-04-13 DIAGNOSIS — Z91040 Latex allergy status: Secondary | ICD-10-CM | POA: Diagnosis not present

## 2024-04-13 DIAGNOSIS — O1203 Gestational edema, third trimester: Secondary | ICD-10-CM

## 2024-04-13 DIAGNOSIS — M79641 Pain in right hand: Secondary | ICD-10-CM | POA: Insufficient documentation

## 2024-04-13 DIAGNOSIS — Z3A34 34 weeks gestation of pregnancy: Secondary | ICD-10-CM | POA: Insufficient documentation

## 2024-04-13 DIAGNOSIS — Z3689 Encounter for other specified antenatal screening: Secondary | ICD-10-CM | POA: Insufficient documentation

## 2024-04-13 DIAGNOSIS — O26893 Other specified pregnancy related conditions, third trimester: Secondary | ICD-10-CM | POA: Diagnosis present

## 2024-04-13 DIAGNOSIS — Z3480 Encounter for supervision of other normal pregnancy, unspecified trimester: Secondary | ICD-10-CM

## 2024-04-13 LAB — URINALYSIS, ROUTINE W REFLEX MICROSCOPIC
Bilirubin Urine: NEGATIVE
Glucose, UA: NEGATIVE mg/dL
Hgb urine dipstick: NEGATIVE
Ketones, ur: NEGATIVE mg/dL
Leukocytes,Ua: NEGATIVE
Nitrite: NEGATIVE
Protein, ur: NEGATIVE mg/dL
Specific Gravity, Urine: 1.016 (ref 1.005–1.030)
pH: 6 (ref 5.0–8.0)

## 2024-04-13 NOTE — Discharge Instructions (Signed)
 You came into the hospital because you had swelling this morning that was painful.  On exam, there was a small amount of swelling in your legs and no swelling noted in your hands.  You also noted that the swelling had gone back down to normal.  Your blood pressures have been normal while you have been here.  We did an ultrasound to show that the baby was head down.  I have also provided you a letter for work restrictions during your pregnancy.  Please follow-up with your OB as scheduled.
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
## 2024-04-13 NOTE — MAU Note (Signed)
 Catherine Weber is a 34 y.o. at [redacted]w[redacted]d here in MAU reporting: ongoing bilateral edema in hands and feet x 1 week. 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 States she drove two hours yesterday and noticed the swelling again last night into this morning. States hands were so swollen and stiff she could not make a fist. Her employer told her to come be seen in MAU to determine if she could continue working. All symptoms have now resolved. Denies vaginal bleeding or leaking of fluid. Feeling baby move as expected for gestational age.  LMP: 08/12/23 Onset of complaint: Late December Pain score: 5/10 Hands (when swollen); 0/10 Currently Vitals:   04/13/24 1155  BP: 108/68  Pulse: 76  Resp: 15  Temp: 98.1 F (36.7 C)  SpO2: 100%     FHT: 136bpm Lab orders placed from triage: Urinalysis

## 2024-04-14 ENCOUNTER — Encounter: Payer: Self-pay | Admitting: Physician Assistant

## 2024-04-17 ENCOUNTER — Encounter: Admitting: Obstetrics

## 2024-04-22 ENCOUNTER — Encounter: Payer: Self-pay | Admitting: Pulmonary Disease

## 2024-04-29 ENCOUNTER — Other Ambulatory Visit (HOSPITAL_COMMUNITY)
Admission: RE | Admit: 2024-04-29 | Discharge: 2024-04-29 | Disposition: A | Discharge status: Home / Self Care | Source: Ambulatory Visit | Attending: Advanced Practice Midwife | Admitting: Advanced Practice Midwife

## 2024-04-29 ENCOUNTER — Ambulatory Visit (INDEPENDENT_AMBULATORY_CARE_PROVIDER_SITE_OTHER): Admitting: Advanced Practice Midwife

## 2024-04-29 ENCOUNTER — Encounter: Payer: Self-pay | Admitting: Advanced Practice Midwife

## 2024-04-29 VITALS — BP 109/70 | HR 71 | Wt 183.8 lb

## 2024-04-29 DIAGNOSIS — Z3480 Encounter for supervision of other normal pregnancy, unspecified trimester: Secondary | ICD-10-CM

## 2024-04-29 DIAGNOSIS — Z3A36 36 weeks gestation of pregnancy: Secondary | ICD-10-CM

## 2024-04-29 DIAGNOSIS — O99353 Diseases of the nervous system complicating pregnancy, third trimester: Secondary | ICD-10-CM | POA: Diagnosis not present

## 2024-04-29 DIAGNOSIS — G5600 Carpal tunnel syndrome, unspecified upper limb: Secondary | ICD-10-CM | POA: Diagnosis not present

## 2024-04-29 DIAGNOSIS — O99013 Anemia complicating pregnancy, third trimester: Secondary | ICD-10-CM

## 2024-04-29 MED ORDER — WRIST BRACE/LEFT SMALL MISC: 1.0000 | Freq: Every day | 0 refills | Status: AC

## 2024-04-29 MED ORDER — ACCRUFER 30 MG PO CAPS: 1.0000 | ORAL_CAPSULE | Freq: Every day | ORAL | 3 refills | Status: DC

## 2024-04-29 MED ORDER — WRIST BRACE/RIGHT SMALL MISC: 1.0000 | Freq: Every day | 0 refills | Status: AC

## 2024-04-29 MED ORDER — ACCRUFER 30 MG PO CAPS: 1.0000 | ORAL_CAPSULE | Freq: Every day | ORAL | 3 refills | Status: AC

## 2024-04-29 NOTE — Addendum Note (Signed)
 Addended by: LANG RIGGS A on: 04/29/2024 03:44 PM   Modules accepted: Orders

## 2024-04-29 NOTE — Progress Notes (Signed)
 "  PRENATAL VISIT NOTE  Subjective:  Catherine Weber is a 34 y.o. G2P0010 at [redacted]w[redacted]d being seen today for ongoing prenatal care.  She is currently monitored for the following issues for this low-risk pregnancy and has Bilateral occipital neuralgia; Chronic pain syndrome; Displacement of cervical intervertebral disc; Displacement of lumbar intervertebral disc without myelopathy; History of migraine; Supervision of other normal pregnancy, antepartum; UTI in pregnancy, antepartum; and Anemia affecting pregnancy in third trimester on their problem list.  Patient reports no complaints.  Contractions: Irritability. Vag. Bleeding: None.  Movement: Present. Denies leaking of fluid.   The following portions of the patient's history were reviewed and updated as appropriate: allergies, current medications, past family history, past medical history, past social history, past surgical history and problem list.   Objective:   Vitals:   04/29/24 1045  BP: 109/70  Pulse: 71  Weight: 183 lb 12.8 oz (83.4 kg)    Fetal Status:  Fetal Heart Rate (bpm): 138 Fundal Height: 36 cm Movement: Present    General: Alert, oriented and cooperative. Patient is in no acute distress.  Skin: Skin is warm and dry. No rash noted.   Cardiovascular: Normal heart rate noted  Respiratory: Normal respiratory effort, no problems with respiration noted  Abdomen: Soft, gravid, appropriate for gestational age.  Pain/Pressure: Present     Pelvic: Cervical exam deferred        Extremities: Normal range of motion.  Edema: Trace  Mental Status: Normal mood and affect. Normal behavior. Normal judgment and thought content.      03/05/2024   10:45 AM 01/10/2024    4:38 PM 12/18/2023    3:24 PM  Depression screen PHQ 2/9  Decreased Interest 0 0 0  Down, Depressed, Hopeless 0 0 0  PHQ - 2 Score 0 0 0  Altered sleeping 2 2 3   Tired, decreased energy 2 1 3   Change in appetite 0 0 0  Feeling bad or failure about yourself  0 0 0  Trouble  concentrating 0 0 0  Moving slowly or fidgety/restless 0 0 0  Suicidal thoughts 0 0 0  PHQ-9 Score 4 3  6       Data saved with a previous flowsheet row definition        03/05/2024   10:46 AM 01/10/2024    4:39 PM 12/18/2023    3:27 PM  GAD 7 : Generalized Anxiety Score  Nervous, Anxious, on Edge 0  1  1   Control/stop worrying 0  0  0   Worry too much - different things 0  0  0   Trouble relaxing 2  1  0   Restless 0  0  0   Easily annoyed or irritable 0  1  0   Afraid - awful might happen 0  0  0   Total GAD 7 Score 2 3 1      Data saved with a previous flowsheet row definition    Assessment and Plan:  Pregnancy: G2P0010 at [redacted]w[redacted]d 1. Supervision of other normal pregnancy, antepartum --Anticipatory guidance about next visits/weeks of pregnancy given.   2. Anemia affecting pregnancy in third trimester (Primary) -No s/sx - Ferric Maltol  (ACCRUFER ) 30 MG CAPS; Take 1 capsule (30 mg total) by mouth daily.  Dispense: 30 capsule; Refill: 3  3. Pregnancy related carpal tunnel syndrome in third trimester  - Elastic Bandages & Supports (WRIST BRACE/LEFT SMALL) MISC; 1 Device by Does not apply route daily.  Dispense: 1 each; Refill: 0 -  Elastic Bandages & Supports (WRIST BRACE/RIGHT SMALL) MISC; 1 Device by Does not apply route daily.  Dispense: 1 each; Refill: 0  - Elastic Bandages & Supports (WRIST BRACE/LEFT SMALL) MISC; 1 Device by Does not apply route daily.  Dispense: 1 each; Refill: 0 - Elastic Bandages & Supports (WRIST BRACE/RIGHT SMALL) MISC; 1 Device by Does not apply route daily.  Dispense: 1 each; Refill: 0  Preterm labor symptoms and general obstetric precautions including but not limited to vaginal bleeding, contractions, leaking of fluid and fetal movement were reviewed in detail with the patient. Please refer to After Visit Summary for other counseling recommendations.   Return in about 1 week (around 05/06/2024) for Midwife preferred.  Future Appointments  Date Time  Provider Department Center  05/07/2024  1:50 PM Delores Nidia CROME, FNP [facility] None  06/24/2024  8:15 AM McDonald, Juliene SAUNDERS, DPM [facility]    Olam Boards, CNM  "

## 2024-04-29 NOTE — Progress Notes (Signed)
 36 week swabs  Pt reports constant left wrist pain for approx 3 weeks (scale 4 of 10).   No there concerns at this time.   Pt declines female student

## 2024-04-30 LAB — CERVICOVAGINAL ANCILLARY ONLY
Bacterial Vaginitis (gardnerella): POSITIVE — AB
Candida Glabrata: NEGATIVE
Candida Vaginitis: NEGATIVE
Chlamydia: NEGATIVE
Comment: NEGATIVE
Comment: NEGATIVE
Comment: NEGATIVE
Comment: NEGATIVE
Comment: NEGATIVE
Comment: NORMAL
Neisseria Gonorrhea: NEGATIVE
Trichomonas: NEGATIVE

## 2024-05-02 ENCOUNTER — Encounter: Payer: Self-pay | Admitting: Pulmonary Disease

## 2024-05-03 LAB — CULTURE, BETA STREP (GROUP B ONLY): Strep Gp B Culture: NEGATIVE

## 2024-05-07 ENCOUNTER — Encounter: Payer: Self-pay | Admitting: Obstetrics and Gynecology

## 2024-05-07 ENCOUNTER — Ambulatory Visit: Payer: Self-pay | Admitting: Advanced Practice Midwife

## 2024-05-07 MED ORDER — CLINDAMYCIN HCL 300 MG PO CAPS: 300.0000 mg | ORAL_CAPSULE | Freq: Three times a day (TID) | ORAL | 0 refills | Status: AC

## 2024-05-12 ENCOUNTER — Encounter: Admitting: Obstetrics and Gynecology

## 2024-05-13 ENCOUNTER — Inpatient Hospital Stay (HOSPITAL_COMMUNITY)
Admission: AD | Admit: 2024-05-13 | Discharge: 2024-05-13 | Disposition: A | Source: Home / Self Care | Attending: Obstetrics and Gynecology | Admitting: Obstetrics and Gynecology

## 2024-05-13 ENCOUNTER — Encounter: Payer: Self-pay | Admitting: Advanced Practice Midwife

## 2024-05-13 ENCOUNTER — Encounter (HOSPITAL_COMMUNITY): Payer: Self-pay | Admitting: Obstetrics and Gynecology

## 2024-05-13 DIAGNOSIS — B9689 Other specified bacterial agents as the cause of diseases classified elsewhere: Secondary | ICD-10-CM

## 2024-05-13 DIAGNOSIS — O23593 Infection of other part of genital tract in pregnancy, third trimester: Secondary | ICD-10-CM | POA: Diagnosis not present

## 2024-05-13 DIAGNOSIS — Z3A38 38 weeks gestation of pregnancy: Secondary | ICD-10-CM

## 2024-05-13 DIAGNOSIS — Z3689 Encounter for other specified antenatal screening: Secondary | ICD-10-CM

## 2024-05-13 NOTE — Discharge Instructions (Signed)
 Reasons to return to MAU at [facility]: You begin to have strong, frequent contractions 5 minutes apart or less, each last 1 minute, these have been going on for 1-2 hours, and you cannot walk or talk during them. Your water breaks.  Sometimes it is a big gush of fluid. However, many times it may it may be much more subtle. You should go to the hospital if you have a constant leakage of fluid from your vagina, enough to soak a pad when you are walking around.  You have vaginal bleeding.  It is normal to have a small amount of spotting if your cervix was checked. If you have bleeding requiring the use of a pad, go to the hospital. You don't feel your baby moving like normal.  If you think that you baby's movement is decreased, eat a snack and rest on your left side in a quiet room for one hour. If you have not felt the baby move more than 6 times in an hour GO TO THE HOSPITAL.
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
## 2024-05-15 ENCOUNTER — Encounter: Admitting: Physician Assistant

## 2024-05-15 ENCOUNTER — Encounter: Payer: Self-pay | Admitting: Advanced Practice Midwife

## 2024-05-19 ENCOUNTER — Encounter: Admitting: Obstetrics and Gynecology

## 2024-06-24 ENCOUNTER — Ambulatory Visit: Admitting: Podiatry
